# Patient Record
Sex: Male | Born: 1965 | Race: White | Hispanic: No | Marital: Married | Smoking: Former smoker
Health system: Southern US, Community
[De-identification: ages and names within clinical notes are randomized; demographics above are authoritative.]

## PROBLEM LIST (undated history)

## (undated) ENCOUNTER — Ambulatory Visit: Admission: EM

## (undated) DIAGNOSIS — I609 Nontraumatic subarachnoid hemorrhage, unspecified: Secondary | ICD-10-CM

## (undated) DIAGNOSIS — M549 Dorsalgia, unspecified: Secondary | ICD-10-CM

## (undated) DIAGNOSIS — G43909 Migraine, unspecified, not intractable, without status migrainosus: Secondary | ICD-10-CM

## (undated) HISTORY — DX: Dorsalgia, unspecified: M54.9

## (undated) HISTORY — DX: Nontraumatic subarachnoid hemorrhage, unspecified: I60.9

## (undated) HISTORY — DX: Migraine, unspecified, not intractable, without status migrainosus: G43.909

## (undated) HISTORY — PX: BACK SURGERY: SHX140

---

## 2012-02-09 ENCOUNTER — Emergency Department (HOSPITAL_COMMUNITY): Payer: PRIVATE HEALTH INSURANCE

## 2012-02-09 ENCOUNTER — Emergency Department (HOSPITAL_COMMUNITY)
Admission: EM | Admit: 2012-02-09 | Discharge: 2012-02-10 | Disposition: A | Discharge status: Home / Self Care | Payer: PRIVATE HEALTH INSURANCE | Attending: Emergency Medicine | Admitting: Emergency Medicine

## 2012-02-09 ENCOUNTER — Encounter (HOSPITAL_COMMUNITY): Payer: Self-pay | Admitting: Emergency Medicine

## 2012-02-09 DIAGNOSIS — R109 Unspecified abdominal pain: Secondary | ICD-10-CM

## 2012-02-09 DIAGNOSIS — F17200 Nicotine dependence, unspecified, uncomplicated: Secondary | ICD-10-CM | POA: Insufficient documentation

## 2012-02-09 DIAGNOSIS — R1031 Right lower quadrant pain: Secondary | ICD-10-CM | POA: Insufficient documentation

## 2012-02-09 LAB — CBC WITH DIFFERENTIAL/PLATELET
Eosinophils Absolute: 0.2 10*3/uL (ref 0.0–0.7)
Eosinophils Relative: 1 % (ref 0–5)
HCT: 44.2 % (ref 39.0–52.0)
Lymphocytes Relative: 18 % (ref 12–46)
Lymphs Abs: 2.4 10*3/uL (ref 0.7–4.0)
MCH: 30.5 pg (ref 26.0–34.0)
MCV: 86 fL (ref 78.0–100.0)
Monocytes Absolute: 0.6 10*3/uL (ref 0.1–1.0)
Platelets: 280 10*3/uL (ref 150–400)
RBC: 5.14 MIL/uL (ref 4.22–5.81)
WBC: 12.9 10*3/uL — ABNORMAL HIGH (ref 4.0–10.5)

## 2012-02-09 LAB — COMPREHENSIVE METABOLIC PANEL
ALT: 13 U/L (ref 0–53)
BUN: 16 mg/dL (ref 6–23)
CO2: 22 mEq/L (ref 19–32)
Calcium: 9.2 mg/dL (ref 8.4–10.5)
Creatinine, Ser: 0.9 mg/dL (ref 0.50–1.35)
GFR calc Af Amer: >90 mL/min (ref >90)
GFR calc non Af Amer: >90 mL/min (ref >90)
Glucose, Bld: 158 mg/dL — ABNORMAL HIGH (ref 70–99)
Sodium: 137 mEq/L (ref 135–145)
Total Protein: 7 g/dL (ref 6.0–8.3)

## 2012-02-09 LAB — URINALYSIS, ROUTINE W REFLEX MICROSCOPIC
Nitrite: NEGATIVE
Protein, ur: NEGATIVE mg/dL
Specific Gravity, Urine: 1.02 (ref 1.005–1.030)
Urobilinogen, UA: 0.2 mg/dL (ref 0.0–1.0)

## 2012-02-09 LAB — LIPASE, BLOOD: Lipase: 21 U/L (ref 11–59)

## 2012-02-09 LAB — URINE MICROSCOPIC-ADD ON

## 2012-02-09 MED ORDER — HYDROMORPHONE HCL PF 1 MG/ML IJ SOLN
1.0000 mg | Freq: Once | INTRAMUSCULAR | Status: AC
  Administered 2012-02-09: 1 mg via INTRAVENOUS
  Filled 2012-02-09: qty 1

## 2012-02-09 MED ORDER — ONDANSETRON HCL 4 MG/2ML IJ SOLN
4.0000 mg | Freq: Once | INTRAMUSCULAR | Status: AC
Start: 2012-02-09 — End: 2012-02-09
  Administered 2012-02-09: 4 mg via INTRAVENOUS
  Filled 2012-02-09: qty 2

## 2012-02-09 NOTE — ED Notes (Signed)
Pt c/o r abd sharp and shooting pain that radiate to right side of back since 1pm today.  Denies n/v or diarrhea.  No hx of kidney stones.

## 2012-02-09 NOTE — ED Provider Notes (Signed)
History     CSN: [id number]  Arrival date & time 02/09/12  Glen Carter   First MD Initiated Contact with Patient 02/09/12 2125      Chief Complaint  Patient presents with  . Abdominal Pain   HPI  History provided by the patient. Patient is a 46 year old male with history of lower back surgery who presents with complaints of right-sided abdominal pain that began earlier today. Patient states that pain began acutely around 1 PM while at work. Patient is sharp. Pain has since radiates some to the right side and flank area. Patient went to urgent care Center for evaluation bolus told he would need to come to emergency room for further evaluation. Patient has no prior history of abdominal surgery. He denies feeling any subjective fever, chills or sweats. Denies any episodes of nausea vomiting. Denies any recent constipation or diarrhea. He denies any other aggravating or alleviating factors.    History reviewed. No pertinent past medical history.  Past Surgical History  Procedure Date  . Back surgery     No family history on file.  History  Substance Use Topics  . Smoking status: Current Everyday Smoker -- 1.0 packs/day    Types: Cigarettes  . Smokeless tobacco: Not on file  . Alcohol Use: No      Review of Systems  Constitutional: Negative for fever, chills and appetite change.  Respiratory: Negative for cough and shortness of breath.   Cardiovascular: Negative for chest pain.  Gastrointestinal: Positive for abdominal pain. Negative for nausea, vomiting, diarrhea and constipation.  Genitourinary: Negative for dysuria, frequency, hematuria, flank pain and discharge.  All other systems reviewed and are negative.    Allergies  Review of patient's allergies indicates no known allergies.  Home Medications   Current Outpatient Rx  Name Route Sig Dispense Refill  . IBUPROFEN 200 MG PO TABS Oral Take 800 mg by mouth every 6 (six) hours as needed.      BP 115/65  Pulse 64  Temp  98.1 F (36.7 C) (Oral)  Resp 16  Wt 110 lb (49.896 kg)  SpO2 100%  Physical Exam  Nursing note and vitals reviewed. Constitutional: He is oriented to person, place, and time. He appears well-developed and well-nourished. No distress.  HENT:  Head: Normocephalic.  Cardiovascular: Normal rate and regular rhythm.   Pulmonary/Chest: Effort normal and breath sounds normal.  Abdominal: Soft. He exhibits no distension. There is tenderness in the right lower quadrant. There is no rebound, no guarding, no CVA tenderness, no tenderness at McBurney's point and negative Murphy's sign.  Neurological: He is alert and oriented to person, place, and time.  Skin: Skin is warm.  Psychiatric: He has a normal mood and affect. His behavior is normal.    ED Course  Procedures   Results for orders placed during the hospital encounter of 02/09/12  CBC WITH DIFFERENTIAL      Component Value Range   WBC 12.9 (*) 4.0 - 10.5 K/uL   RBC 5.14  4.22 - 5.81 MIL/uL   Hemoglobin 15.7  13.0 - 17.0 g/dL   HCT 21.3  08.6 - 57.8 %   MCV 86.0  78.0 - 100.0 fL   MCH 30.5  26.0 - 34.0 pg   MCHC 35.5  30.0 - 36.0 g/dL   RDW 46.9  62.9 - 52.8 %   Platelets 280  150 - 400 K/uL   Neutrophils Relative 75  43 - 77 %   Neutro Abs 9.7 (*) 1.7 -  7.7 K/uL   Lymphocytes Relative 18  12 - 46 %   Lymphs Abs 2.4  0.7 - 4.0 K/uL   Monocytes Relative 5  3 - 12 %   Monocytes Absolute 0.6  0.1 - 1.0 K/uL   Eosinophils Relative 1  0 - 5 %   Eosinophils Absolute 0.2  0.0 - 0.7 K/uL   Basophils Relative 1  0 - 1 %   Basophils Absolute 0.1  0.0 - 0.1 K/uL  COMPREHENSIVE METABOLIC PANEL      Component Value Range   Sodium 137  135 - 145 mEq/L   Potassium 3.8  3.5 - 5.1 mEq/L   Chloride 102  96 - 112 mEq/L   CO2 22  19 - 32 mEq/L   Glucose, Bld 158 (*) 70 - 99 mg/dL   BUN 16  6 - 23 mg/dL   Creatinine, Ser 4.09  0.50 - 1.35 mg/dL   Calcium 9.2  8.4 - 81.1 mg/dL   Total Protein 7.0  6.0 - 8.3 g/dL   Albumin 4.3  3.5 - 5.2  g/dL   AST 15  0 - 37 U/L   ALT 13  0 - 53 U/L   Alkaline Phosphatase 94  39 - 117 U/L   Total Bilirubin 0.3  0.3 - 1.2 mg/dL   GFR calc non Af Amer >90  >90 mL/min   GFR calc Af Amer >90  >90 mL/min  LIPASE, BLOOD      Component Value Range   Lipase 21  11 - 59 U/L  URINALYSIS, ROUTINE W REFLEX MICROSCOPIC      Component Value Range   Color, Urine YELLOW  YELLOW   APPearance CLEAR  CLEAR   Specific Gravity, Urine 1.020  1.005 - 1.030   pH 6.5  5.0 - 8.0   Glucose, UA NEGATIVE  NEGATIVE mg/dL   Hgb urine dipstick TRACE (*) NEGATIVE   Bilirubin Urine NEGATIVE  NEGATIVE   Ketones, ur 40 (*) NEGATIVE mg/dL   Protein, ur NEGATIVE  NEGATIVE mg/dL   Urobilinogen, UA 0.2  0.0 - 1.0 mg/dL   Nitrite NEGATIVE  NEGATIVE   Leukocytes, UA NEGATIVE  NEGATIVE  URINE MICROSCOPIC-ADD ON      Component Value Range   RBC / HPF 3-6  <3 RBC/hpf      Ct Abdomen Pelvis W Contrast  02/10/2012  *RADIOLOGY REPORT*  Clinical Data: Right lower quadrant abdominal pain radiating into the right back.  Microscopic hematuria and elevated white blood cell count.  CT ABDOMEN AND PELVIS WITH CONTRAST  Technique:  Multidetector CT imaging of the abdomen and pelvis was performed following the standard protocol during bolus administration of intravenous contrast.  Contrast: OMNIPAQUE IOHEXOL 300 MG/ML SOLN  Comparison: None.  Findings: The liver, gallbladder, spleen, pancreas, adrenal glands and kidneys are within normal limits.  No renal calculi or masses are identified.  The ureters contain no calculi.  The bladder is unremarkable.  Bowel loops show no evidence of inflammation or obstruction.  The appendix is visible off of the tip of the cecum and is normal in appearance with no evidence of appendicitis.  No free fluid or abscess is identified.  No evidence of bowel perforation.  No hernias are seen.  No masses or enlarged lymph nodes.  The abdominal aorta is of normal caliber.  Bony structures show significant  spondylosis at L5-S1 with prominent endplate sclerosis present on both sides of the disc and vacuum disc phenomenon as well as  near complete loss of disc space height.  IMPRESSION: No acute process is identified in the abdomen or pelvis.  There is no evidence of acute appendicitis.  Advanced spondylosis is present at L5-S1.  Original Report Authenticated By: Reola Calkins, M.D.     1. Abdominal pain       MDM  Pt seen and evaluated. Patient sent from urgent care Center for further evaluation of right-sided abdominal pain. Patient with no peritoneal signs on exam. At this time will treat pain and evaluated lab testing.   Patient reports feeling much better after pain medications. Patient has slightly elevated WC. Will order CT scan to rule out early acute appendicitis.   CT scan unremarkable. Patient has continued to be comfortable. This time we'll discharge home with instructions for followup with PCP.     Angus Seller, [place] 02/10/12 [phone]

## 2012-02-09 NOTE — ED Notes (Signed)
Pt alert, arrives from home, c/o rlq abd pain, onset was today, describes as sharp, radiates to back, resp even unlabored, skin pwd, seen in urgent care, instructed to come to ER

## 2012-02-10 IMAGING — CT CT ABD-PELV W/ CM
1 of 3 series · 14 of 32 positions shown, 19 images · IV contrast (100 ML OMNI 300)
Comparison: None.

CLINICAL DATA: Right lower quadrant abdominal pain radiating into
the right back.  Microscopic hematuria and elevated white blood
cell count.

CT ABDOMEN AND PELVIS WITH CONTRAST
TECHNIQUE: Multidetector CT imaging of the abdomen and pelvis was
performed following the standard protocol during bolus
administration of intravenous contrast.
Contrast: 100mL OMNIPAQUE IOHEXOL 300 MG/ML SOLN

[Series 2: abd/pel with · axial · 0.57mm/px · z∈[+988,+1318]mm · 14 of 74 slices shown, 19 images]
[im 4/74  soft-tissue]
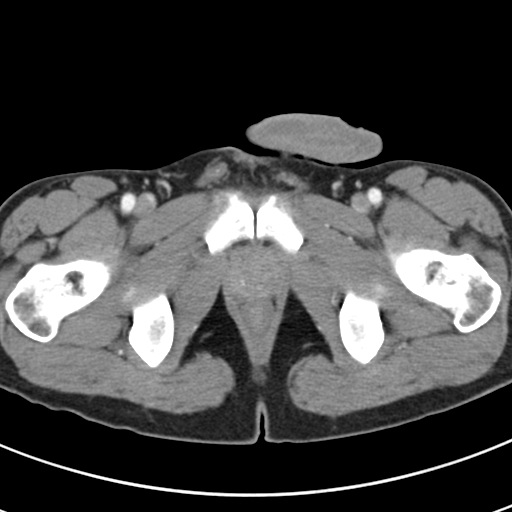
[im 4/74  bone]
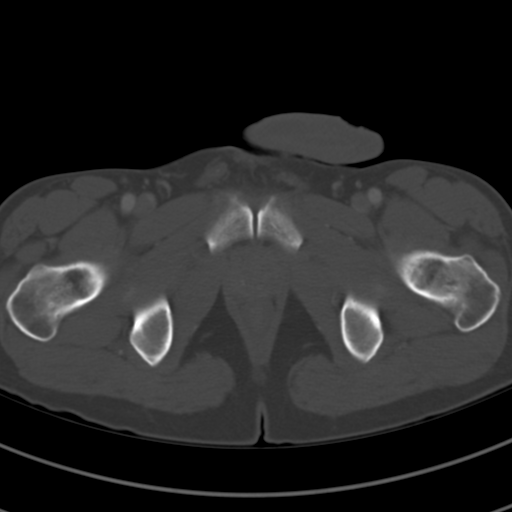
[im 11/74  soft-tissue]
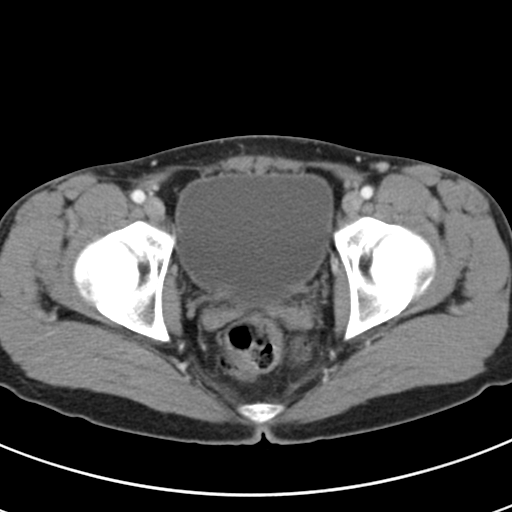
[im 15/74  soft-tissue]
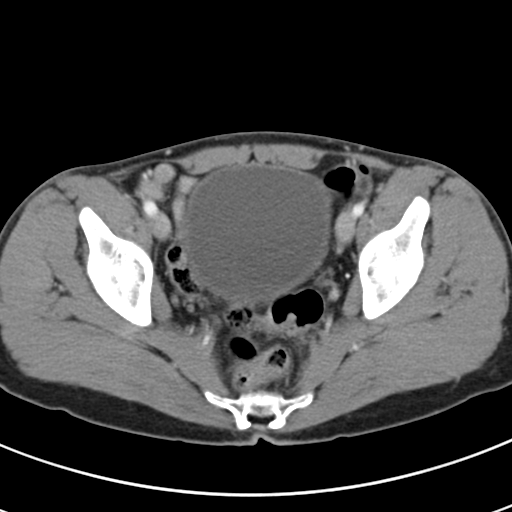
[im 22/74  soft-tissue]
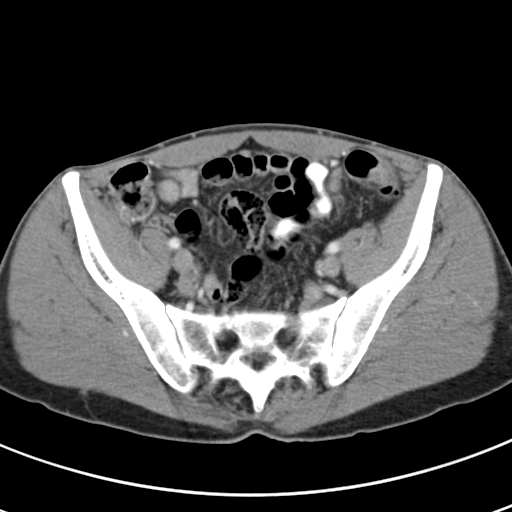
[im 26/74  soft-tissue]
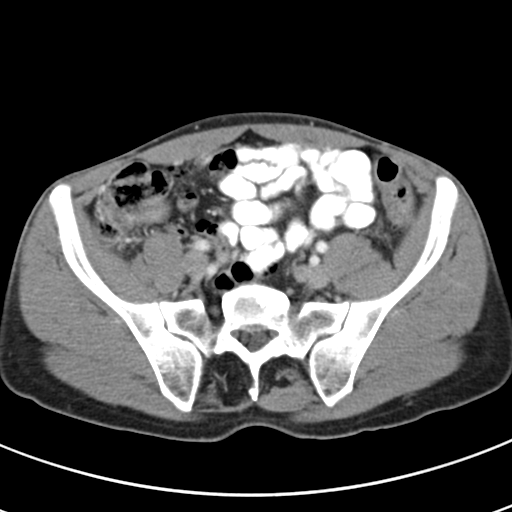
[im 33/74  soft-tissue]
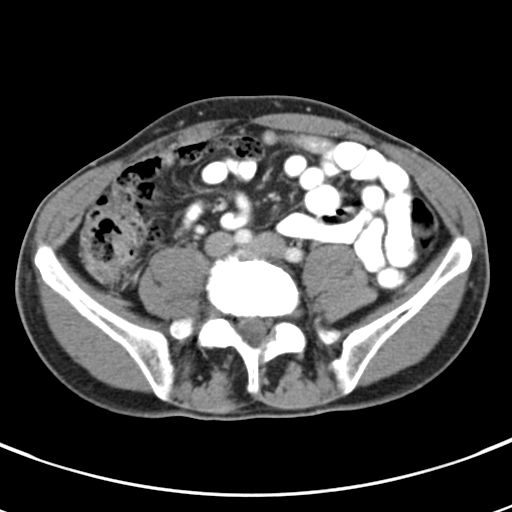
[im 37/74  soft-tissue]
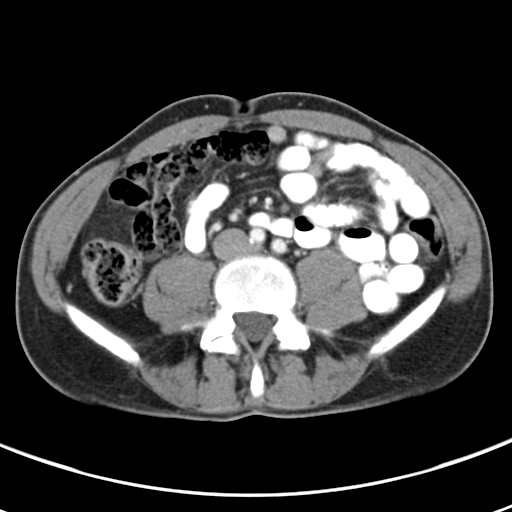
[im 41/74  soft-tissue]
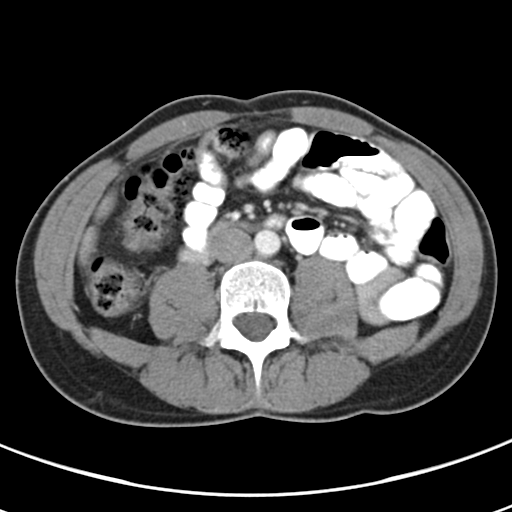
[im 48/74  soft-tissue]
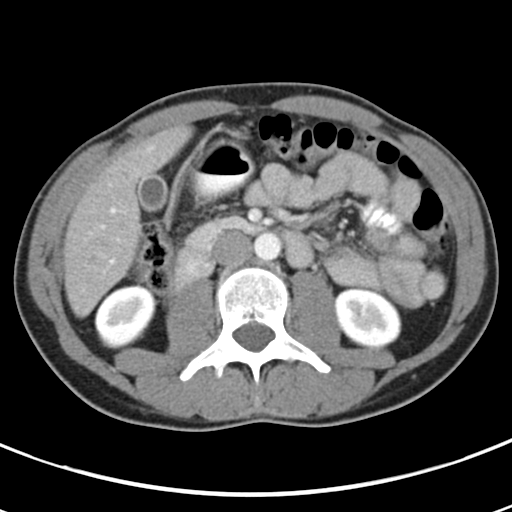
[im 48/74  bone]
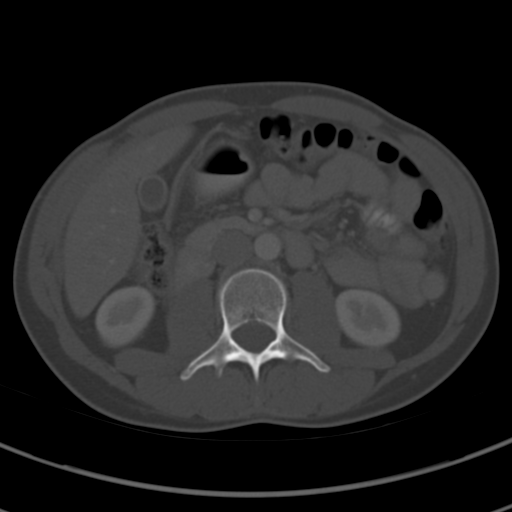
[im 52/74  soft-tissue]
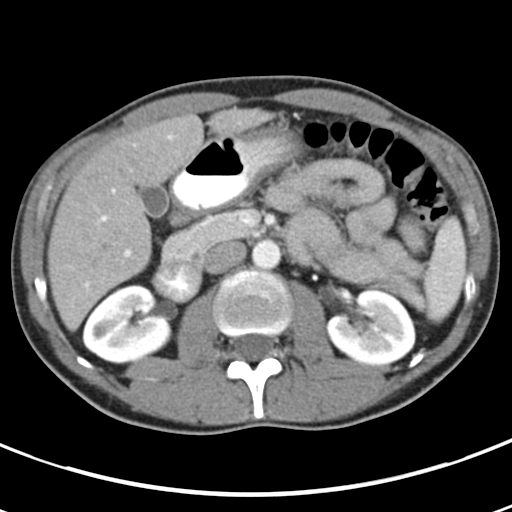
[im 59/74  soft-tissue]
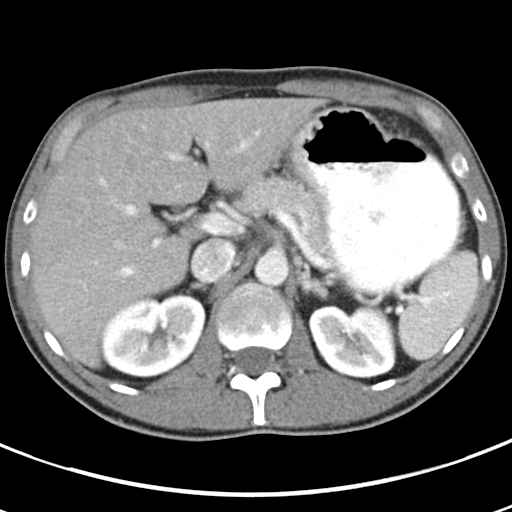
[im 59/74  lung]
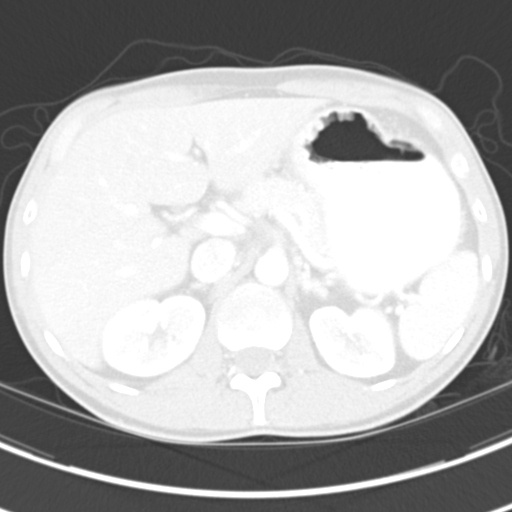
[im 63/74  soft-tissue]
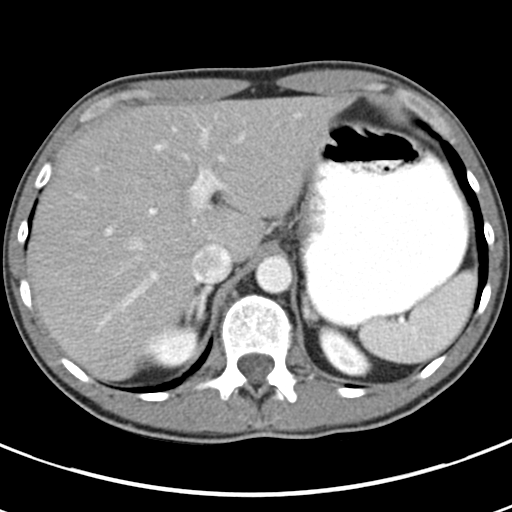
[im 63/74  lung]
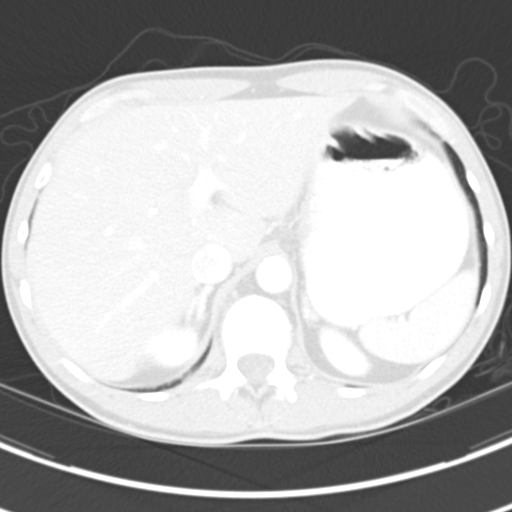
[im 66/74  lung]
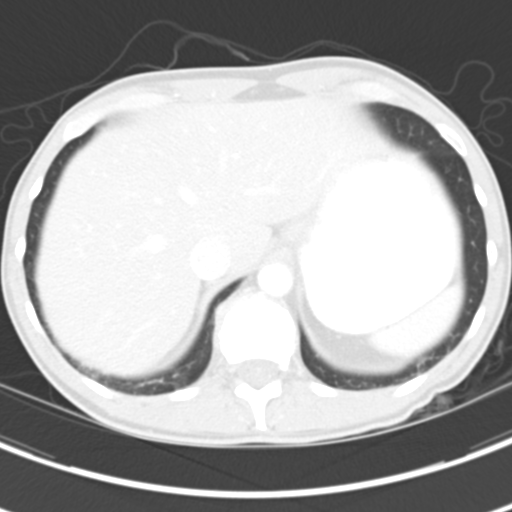
[im 70/74  soft-tissue]
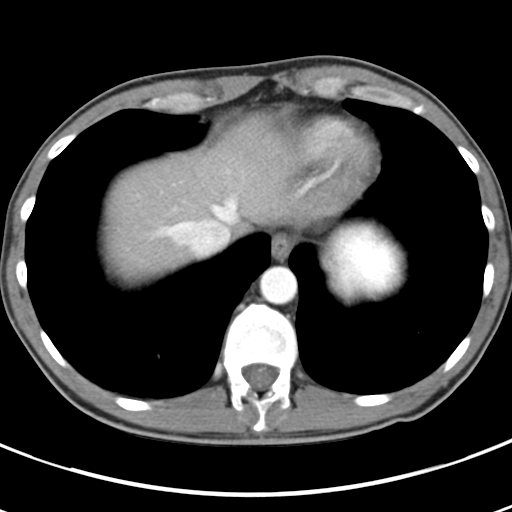
[im 70/74  lung]
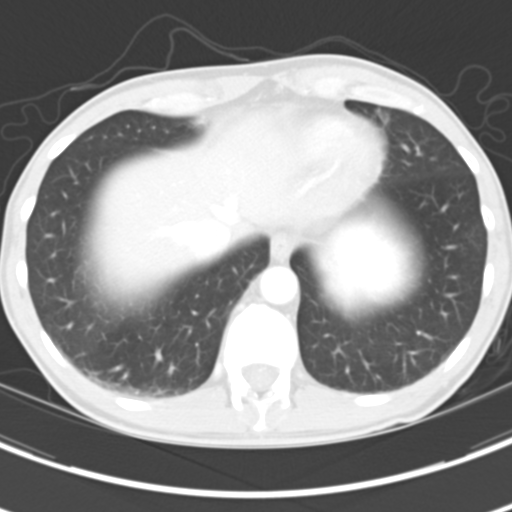

[14 of 32 positions shown; findings below may reference images not displayed]

FINDINGS: The liver, gallbladder, spleen, pancreas, adrenal glands
and kidneys are within normal limits.  No renal calculi or masses
are identified.  The ureters contain no calculi.  The bladder is
unremarkable.

Bowel loops show no evidence of inflammation or obstruction.  The
appendix is visible off of the tip of the cecum and is normal in
appearance with no evidence of appendicitis.  No free fluid or
abscess is identified.  No evidence of bowel perforation.  No
hernias are seen.  No masses or enlarged lymph nodes.  The
abdominal aorta is of normal caliber.  Bony structures show
significant spondylosis at L5-S1 with prominent endplate sclerosis
present on both sides of the disc and vacuum disc phenomenon as
well as near complete loss of disc space height.
IMPRESSION: No acute process is identified in the abdomen or pelvis.  There is
no evidence of acute appendicitis.  Advanced spondylosis is present
at L5-S1.

## 2012-02-10 MED ORDER — IOHEXOL 300 MG/ML  SOLN
100.0000 mL | Freq: Once | INTRAMUSCULAR | Status: AC | PRN
  Administered 2012-02-10: 100 mL via INTRAVENOUS

## 2012-02-10 MED ORDER — HYDROCODONE-ACETAMINOPHEN 5-325 MG PO TABS: 1.0000 | ORAL_TABLET | ORAL | Status: DC | PRN

## 2012-02-11 NOTE — ED Provider Notes (Signed)
Medical screening examination/treatment/procedure(s) were performed by non-physician practitioner and as supervising physician I was immediately available for consultation/collaboration.  Gerhard Munch, MD 02/11/12 [phone]

## 2012-02-13 ENCOUNTER — Encounter (HOSPITAL_COMMUNITY): Payer: Self-pay | Admitting: *Deleted

## 2012-02-13 ENCOUNTER — Emergency Department (HOSPITAL_COMMUNITY)
Admission: EM | Admit: 2012-02-13 | Discharge: 2012-02-13 | Disposition: A | Discharge status: Home / Self Care | Payer: PRIVATE HEALTH INSURANCE | Attending: Emergency Medicine | Admitting: Emergency Medicine

## 2012-02-13 DIAGNOSIS — M545 Low back pain, unspecified: Secondary | ICD-10-CM | POA: Insufficient documentation

## 2012-02-13 DIAGNOSIS — M549 Dorsalgia, unspecified: Secondary | ICD-10-CM

## 2012-02-13 MED ORDER — IBUPROFEN 800 MG PO TABS: 800.0000 mg | ORAL_TABLET | Freq: Three times a day (TID) | ORAL | Status: AC | PRN

## 2012-02-13 MED ORDER — METHOCARBAMOL 500 MG PO TABS: 1000.0000 mg | ORAL_TABLET | Freq: Four times a day (QID) | ORAL | Status: AC

## 2012-02-13 MED ORDER — HYDROCODONE-ACETAMINOPHEN 5-325 MG PO TABS: ORAL_TABLET | ORAL | Status: AC

## 2012-02-13 NOTE — ED Provider Notes (Signed)
History     CSN: [id number]  Arrival date & time 02/13/12  4098   First MD Initiated Contact with Patient 02/13/12 1020      Chief Complaint  Patient presents with  . Back Pain    lower right back     (Consider location/radiation/quality/duration/timing/severity/associated sxs/prior treatment) HPI Comments: Patients with a history of an L5-S1 discectomy -- presents with 5 days of lower and middle right-sided back pain. Patient was evaluated in emergency department 5 days ago with right-sided back pain and abdominal pain. Patient had a CT scan which was negative for kidney stone or other intra-abdominal pathology. He was discharged home with Vicodin. This along with ibuprofen has helped his pain. Patient states that the pain is stabbing in nature. It no longer goes to his abdomen. Patient states that it is worse with standing and resolved with lying flat and resting. 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 He has not been able to work since his pain started. Today the pain started the patient's replaced and moved a hot water heater. Course is persistent. Patient denies all red flag signs and symptoms of lower back pain. He denies change in bowel movements, dysuria or hematuria. He denies nausea or vomiting. He denies weakness, numbness, tingling in his lower extremities. These symptoms are not similar to his previous radicular pain.  Patient is a 46 y.o. male presenting with back pain. The history is provided by the patient.  Back Pain  This is a new problem. The current episode started more than 2 days ago. The problem occurs constantly. The problem has not changed since onset.The pain is associated with lifting heavy objects. The pain is present in the thoracic spine and lumbar spine. The quality of the pain is described as stabbing. Radiates to: R abdomen. The pain is moderate. The symptoms are aggravated by certain positions. Pertinent  negatives include no fever, no numbness, no weight loss, no abdominal pain (now resolved), no abdominal swelling, no bowel incontinence, no perianal numbness, no bladder incontinence, no dysuria, no leg pain, no paresthesias and no weakness. He has tried NSAIDs and analgesics for the symptoms. The treatment provided moderate relief.    History reviewed. No pertinent past medical history.  Past Surgical History  Procedure Date  . Back surgery     No family history on file.  History  Substance Use Topics  . Smoking status: Current Everyday Smoker -- 1.0 packs/day    Types: Cigarettes  . Smokeless tobacco: Not on file  . Alcohol Use: No      Review of Systems  Constitutional: Negative for fever, weight loss and unexpected weight change.  Gastrointestinal: Negative for nausea, vomiting, abdominal pain (now resolved), diarrhea, constipation and bowel incontinence.       Neg for fecal incontinence  Genitourinary: Negative for bladder incontinence, dysuria, hematuria, flank pain and difficulty urinating.       Negative for urinary incontinence or retention  Musculoskeletal: Positive for back pain.  Neurological: Negative for weakness, numbness and paresthesias.       Negative for saddle paresthesias     Allergies  Review of patient's allergies indicates no known allergies.  Home Medications   Current Outpatient Rx  Name Route Sig Dispense Refill  . HYDROCODONE-ACETAMINOPHEN 5-325 MG PO TABS Oral Take 1-2 tablets by mouth every 4 (four) hours as needed for pain. 10 tablet 0  . IBUPROFEN 200 MG PO TABS  Oral Take 800 mg by mouth every 6 (six) hours as needed. For pain    . AZO STANDARD MAXIMUM STRENGTH PO Oral Take 2 tablets by mouth daily.      BP 137/89  Pulse 80  Temp 97.9 F (36.6 C) (Oral)  Resp 16  Wt 110 lb (49.896 kg)  SpO2 96%  Physical Exam  Nursing note and vitals reviewed. Constitutional: He appears well-developed and well-nourished.  HENT:  Head:  Normocephalic and atraumatic.  Eyes: Conjunctivae are normal.  Neck: Normal range of motion.  Cardiovascular: Normal rate, regular rhythm and normal heart sounds.   No murmur heard. Pulses:      Dorsalis pedis pulses are 2+ on the right side, and 2+ on the left side.       Posterior tibial pulses are 2+ on the right side, and 2+ on the left side.  Pulmonary/Chest: Effort normal and breath sounds normal. No respiratory distress. He has no wheezes.  Abdominal: Soft. Bowel sounds are normal. He exhibits no pulsatile midline mass and no mass. There is no tenderness. There is no rebound, no guarding and no CVA tenderness.  Musculoskeletal: Normal range of motion. He exhibits no tenderness.       Cervical back: Normal.       Thoracic back: He exhibits tenderness. He exhibits normal range of motion, no bony tenderness and no swelling.       Lumbar back: He exhibits tenderness. He exhibits normal range of motion, no bony tenderness and no swelling.       Back:       No step-off noted with palpation of spine.   Neurological: He is alert. He has normal reflexes. No sensory deficit. He exhibits normal muscle tone. Gait normal.       5/5 strength in entire lower extremities bilaterally. No sensation deficit.   Skin: Skin is warm and dry.  Psychiatric: He has a normal mood and affect.    ED Course  Procedures (including critical care time)  Labs Reviewed - No data to display No results found.   1. Back pain     10:45 AM Patient seen and examined. Patient refuses pain medication here.    Vital signs reviewed and are as follows: Filed Vitals:   02/13/12 1016  BP: 137/89  Pulse: 80  Temp: 97.9 F (36.6 C)  Resp: 16   Patient was seen and examined. No red flag s/s of low back pain. Patient was counseled on back pain precautions and told to do activity as tolerated but do not lift, push, or pull heavy objects more than 10 pounds for the next week.  Patient counseled to use ice or heat on  back for no longer than 15 minutes every hour.   Patient prescribed muscle relaxer and counseled on proper use of muscle relaxant medication.    Patient prescribed narcotic pain medicine and counseled on proper use of narcotic pain medications. Counseled not to combine this medication with others containing tylenol.   Urged patient not to drink alcohol, drive, or perform any other activities that requires focus while taking either of these medications.  Patient urged to follow-up with PCP/ortho if pain does not improve with treatment and rest or if pain becomes recurrent. Urged to return with worsening severe pain, loss of bowel or bladder control, trouble walking.   The patient verbalizes understanding and agrees with the plan.      MDM  Patient with back pain. No neurological deficits. Patient is ambulatory.  Pain is changed with position and movement and seems to have MSK component. No warning symptoms of back pain including: loss of bowel or bladder control, night sweats, waking from sleep with back pain, unexplained fevers or weight loss, h/o cancer, IVDU, recent trauma. No concern for cauda equina, epidural abscess, or other serious cause of back pain. Conservative measures such as rest, ice/heat and pain medicine indicated with PCP follow-up if no improvement with conservative management.   Do not suspect intraabdominal etiology given exam. Do not suspect AAA, recent normal CT abd.          Renne Crigler, [place] 02/13/12 1101

## 2012-02-13 NOTE — ED Notes (Signed)
Pt states "was seen here Wednesday, I'm a plumber, changed out a water heater, they told me I had blood in my urine & could've passed a stone, they gave me 10 vicodin and told me to come back if it didn't get better"; pt denies dysuria.

## 2012-02-13 NOTE — ED Provider Notes (Signed)
Medical screening examination/treatment/procedure(s) were performed by non-physician practitioner and as supervising physician I was immediately available for consultation/collaboration.  Feras Gardella, MD 02/13/12 1445 

## 2012-02-13 NOTE — ED Notes (Signed)
Pt escorted to discharge window, in no distress. 

## 2019-10-14 ENCOUNTER — Ambulatory Visit: Payer: PRIVATE HEALTH INSURANCE | Attending: Internal Medicine

## 2019-10-14 DIAGNOSIS — Z23 Encounter for immunization: Secondary | ICD-10-CM

## 2019-10-14 NOTE — Progress Notes (Signed)
   Covid-19 Vaccination Clinic  Name:  Glen Carter    MRN: [id number] DOB: September 16, 1965  10/14/2019  Mr. [name] was observed post Covid-19 immunization for 15 minutes without incident. He was provided with Vaccine Information Sheet and instruction to access the V-Safe system.   Mr. [name] was instructed to call 911 with any severe reactions post vaccine: Marland Kitchen Difficulty breathing  . Swelling of face and throat  . A fast heartbeat  . A bad rash all over body  . Dizziness and weakness   Immunizations Administered    Name Date Dose VIS Date Route   Pfizer COVID-19 Vaccine 10/14/2019 12:44 PM 0.3 mL 06/29/2019 Intramuscular   Manufacturer: [employer], [employer]   Lot: [id number]   NDC: [address]-0

## 2019-11-06 ENCOUNTER — Ambulatory Visit: Payer: PRIVATE HEALTH INSURANCE | Attending: Internal Medicine

## 2019-11-06 DIAGNOSIS — Z23 Encounter for immunization: Secondary | ICD-10-CM

## 2019-11-06 NOTE — Progress Notes (Signed)
   Covid-19 Vaccination Clinic  Name:  Glen Carter    MRN: [id number] DOB: 01/07/1966  11/06/2019  [name] was observed post Covid-19 immunization for 15 minutes without incident. He was provided with Vaccine Information Sheet and instruction to access the V-Safe system.   [name] was instructed to call 911 with any severe reactions post vaccine: Marland Kitchen Difficulty breathing  . Swelling of face and throat  . A fast heartbeat  . A bad rash all over body  . Dizziness and weakness   Immunizations Administered    Name Date Dose VIS Date Route   Pfizer COVID-19 Vaccine 11/06/2019  3:11 PM 0.3 mL 09/12/2018 Intramuscular   Manufacturer: [employer], [employer]   Lot: [id number]   NDC: [address]-7

## 2020-10-14 ENCOUNTER — Other Ambulatory Visit: Payer: Self-pay

## 2020-10-14 ENCOUNTER — Ambulatory Visit
Admission: EM | Admit: 2020-10-14 | Discharge: 2020-10-14 | Disposition: A | Discharge status: Home / Self Care | Payer: Commercial Managed Care - PPO | Attending: Family Medicine | Admitting: Family Medicine

## 2020-10-14 ENCOUNTER — Other Ambulatory Visit (HOSPITAL_BASED_OUTPATIENT_CLINIC_OR_DEPARTMENT_OTHER): Payer: Self-pay

## 2020-10-14 ENCOUNTER — Inpatient Hospital Stay (HOSPITAL_BASED_OUTPATIENT_CLINIC_OR_DEPARTMENT_OTHER)
Admission: EM | Admit: 2020-10-14 | Discharge: 2020-10-20 | DRG: 066 | Disposition: A | Discharge status: Home / Self Care | Payer: Commercial Managed Care - PPO | Attending: Neurosurgery | Admitting: Neurosurgery

## 2020-10-14 ENCOUNTER — Emergency Department (HOSPITAL_BASED_OUTPATIENT_CLINIC_OR_DEPARTMENT_OTHER): Payer: Commercial Managed Care - PPO

## 2020-10-14 ENCOUNTER — Inpatient Hospital Stay (HOSPITAL_COMMUNITY): Payer: Commercial Managed Care - PPO

## 2020-10-14 ENCOUNTER — Encounter (HOSPITAL_BASED_OUTPATIENT_CLINIC_OR_DEPARTMENT_OTHER): Payer: Self-pay

## 2020-10-14 DIAGNOSIS — R29700 NIHSS score 0: Secondary | ICD-10-CM | POA: Diagnosis present

## 2020-10-14 DIAGNOSIS — F17210 Nicotine dependence, cigarettes, uncomplicated: Secondary | ICD-10-CM | POA: Diagnosis present

## 2020-10-14 DIAGNOSIS — Z20822 Contact with and (suspected) exposure to covid-19: Secondary | ICD-10-CM | POA: Diagnosis present

## 2020-10-14 DIAGNOSIS — R519 Headache, unspecified: Secondary | ICD-10-CM

## 2020-10-14 DIAGNOSIS — I609 Nontraumatic subarachnoid hemorrhage, unspecified: Secondary | ICD-10-CM | POA: Diagnosis present

## 2020-10-14 DIAGNOSIS — R42 Dizziness and giddiness: Secondary | ICD-10-CM

## 2020-10-14 DIAGNOSIS — R231 Pallor: Secondary | ICD-10-CM

## 2020-10-14 HISTORY — DX: Migraine, unspecified, not intractable, without status migrainosus: G43.909

## 2020-10-14 LAB — CBC
HCT: 46.5 % (ref 39.0–52.0)
Hemoglobin: 15.7 g/dL (ref 13.0–17.0)
MCH: 28.9 pg (ref 26.0–34.0)
MCHC: 33.8 g/dL (ref 30.0–36.0)
MCV: 85.6 fL (ref 80.0–100.0)
Platelets: 321 10*3/uL (ref 150–400)
RBC: 5.43 MIL/uL (ref 4.22–5.81)
RDW: 12.4 % (ref 11.5–15.5)
WBC: 12.7 10*3/uL — ABNORMAL HIGH (ref 4.0–10.5)
nRBC: 0 % (ref 0.0–0.2)

## 2020-10-14 LAB — COMPREHENSIVE METABOLIC PANEL
ALT: 15 U/L (ref 0–44)
AST: 14 U/L — ABNORMAL LOW (ref 15–41)
Albumin: 4.4 g/dL (ref 3.5–5.0)
Alkaline Phosphatase: 81 U/L (ref 38–126)
Anion gap: 9 (ref 5–15)
BUN: 19 mg/dL (ref 6–20)
CO2: 27 mmol/L (ref 22–32)
Calcium: 9 mg/dL (ref 8.9–10.3)
Chloride: 103 mmol/L (ref 98–111)
Creatinine, Ser: 1.06 mg/dL (ref 0.61–1.24)
GFR, Estimated: >60 mL/min (ref >60)
Glucose, Bld: 162 mg/dL — ABNORMAL HIGH (ref 70–99)
Potassium: 4 mmol/L (ref 3.5–5.1)
Sodium: 139 mmol/L (ref 135–145)
Total Bilirubin: 0.5 mg/dL (ref 0.3–1.2)
Total Protein: 7.1 g/dL (ref 6.5–8.1)

## 2020-10-14 LAB — CBC WITH DIFFERENTIAL/PLATELET
Abs Immature Granulocytes: 0.13 10*3/uL — ABNORMAL HIGH (ref 0.00–0.07)
Basophils Absolute: 0.1 10*3/uL (ref 0.0–0.1)
Basophils Relative: 1 %
Eosinophils Absolute: 0.3 10*3/uL (ref 0.0–0.5)
Eosinophils Relative: 3 %
HCT: 47.2 % (ref 39.0–52.0)
Hemoglobin: 15.7 g/dL (ref 13.0–17.0)
Immature Granulocytes: 1 %
Lymphocytes Relative: 12 %
Lymphs Abs: 1.3 10*3/uL (ref 0.7–4.0)
MCH: 28.5 pg (ref 26.0–34.0)
MCHC: 33.3 g/dL (ref 30.0–36.0)
MCV: 85.8 fL (ref 80.0–100.0)
Monocytes Absolute: 0.7 10*3/uL (ref 0.1–1.0)
Monocytes Relative: 7 %
Neutro Abs: 8.6 10*3/uL — ABNORMAL HIGH (ref 1.7–7.7)
Neutrophils Relative %: 76 %
Platelets: 322 10*3/uL (ref 150–400)
RBC: 5.5 MIL/uL (ref 4.22–5.81)
RDW: 12.2 % (ref 11.5–15.5)
WBC: 11.2 10*3/uL — ABNORMAL HIGH (ref 4.0–10.5)
nRBC: 0 % (ref 0.0–0.2)

## 2020-10-14 LAB — PROTIME-INR
INR: 1 (ref 0.8–1.2)
INR: 1 (ref 0.8–1.2)
Prothrombin Time: 12.3 seconds (ref 11.4–15.2)
Prothrombin Time: 12.9 seconds (ref 11.4–15.2)

## 2020-10-14 LAB — RESP PANEL BY RT-PCR (FLU A&B, COVID) ARPGX2
Influenza A by PCR: NEGATIVE
Influenza B by PCR: NEGATIVE
SARS Coronavirus 2 by RT PCR: NEGATIVE

## 2020-10-14 LAB — APTT
aPTT: 26 seconds (ref 24–36)
aPTT: 26 seconds (ref 24–36)

## 2020-10-14 LAB — POCT FASTING CBG KUC MANUAL ENTRY: POCT Glucose (KUC): 159 mg/dL — AB (ref 70–99)

## 2020-10-14 IMAGING — CT CT ANGIO NECK
2 of 4 series · 8 of 14 positions shown · IV contrast (APPLIED)
Comparison: Noncontrast head CT performed earlier today [DATE].

CLINICAL DATA: Stroke/TIA, assess extracranial arteries.

EXAM:
CT ANGIOGRAPHY HEAD AND NECK
TECHNIQUE: Multidetector CT imaging of the head and neck was performed using
the standard protocol during bolus administration of intravenous
contrast. Multiplanar CT image reconstructions and MIPs were
obtained to evaluate the vascular anatomy. Carotid stenosis
measurements (when applicable) are obtained utilizing NASCET
criteria, using the distal internal carotid diameter as the
denominator.
CONTRAST:  75mL OMNIPAQUE IOHEXOL 350 MG/ML SOLN

[Series 7: cta head & neck · axial · 0.51mm/px · z∈[-318,-40]mm · 6 of 764 slices shown]
[im 110/764  soft-tissue]
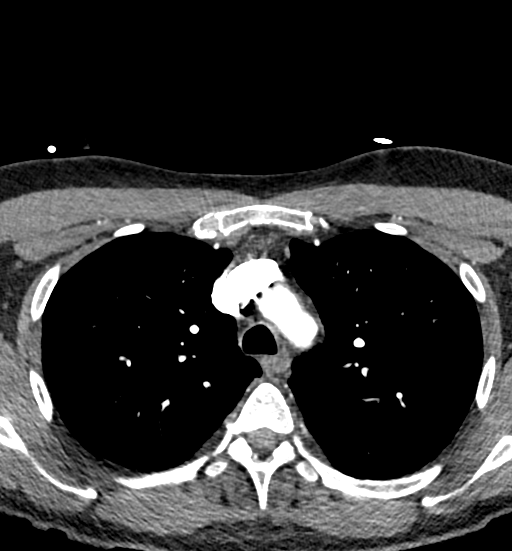
[im 219/764  bone]
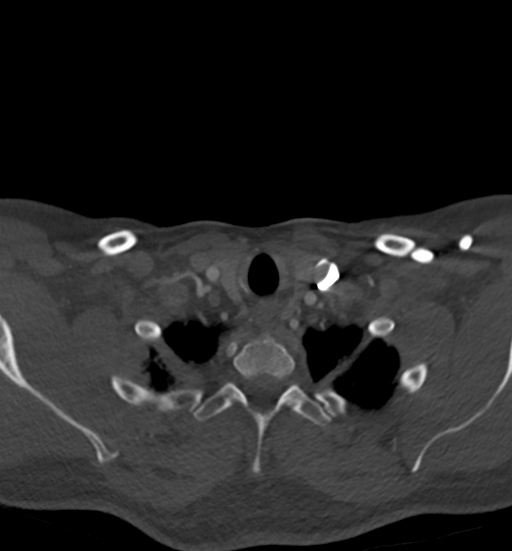
[im 328/764  soft-tissue]
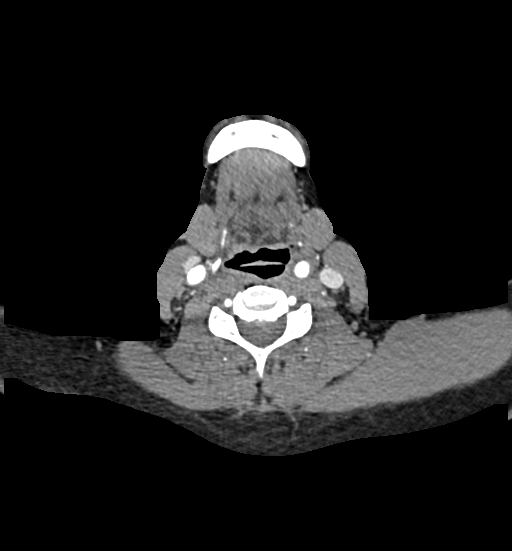
[im 437/764  bone]
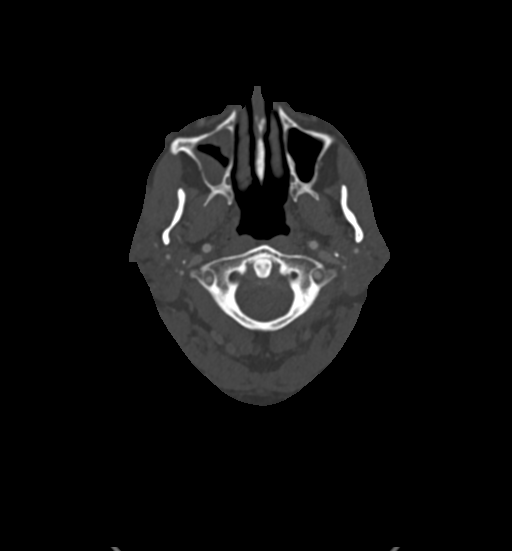
[im 546/764  soft-tissue]
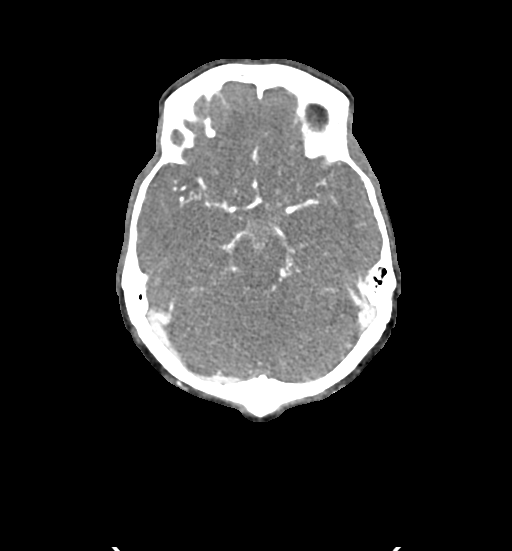
[im 655/764  bone]
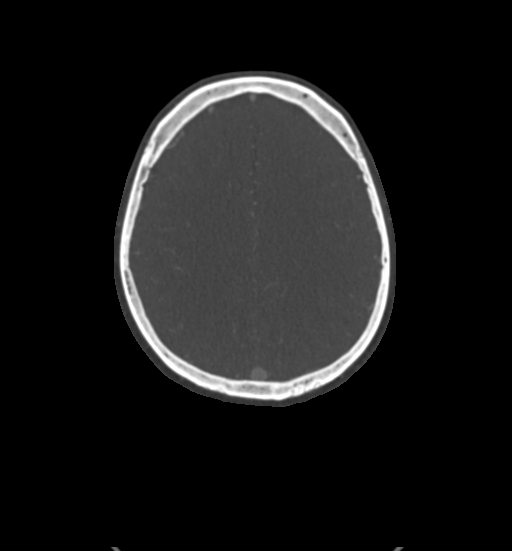

[Series 8: ax thin · axial · 0.57mm/px · z∈[-245,-113]mm · 2 of 385 slices shown]
[im 129/385  soft-tissue]
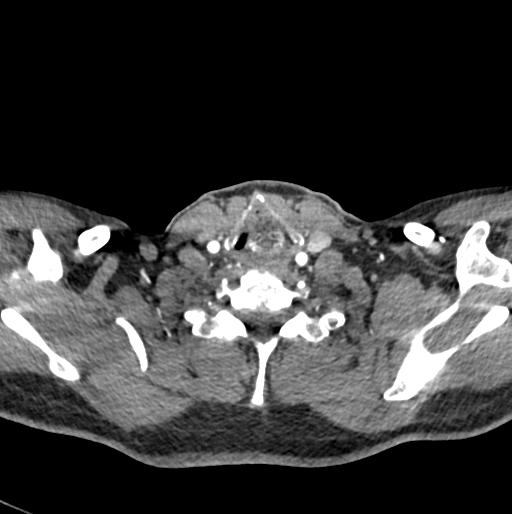
[im 257/385  soft-tissue]
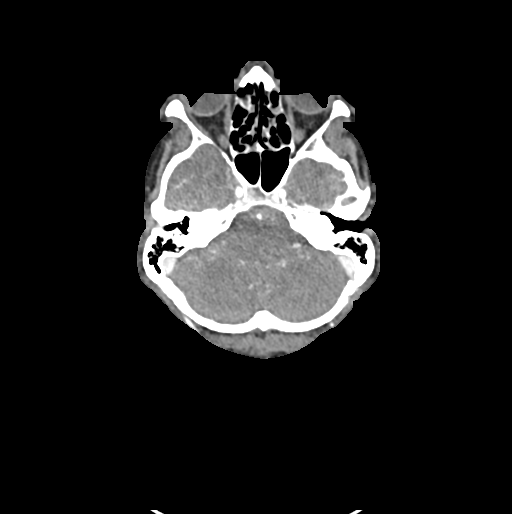

[8 of 14 positions shown; findings below may reference images not displayed]

FINDINGS: CTA NECK FINDINGS

Aortic arch: Standard aortic branching. The visualized aortic arch
is normal in caliber. No hemodynamically significant innominate or
proximal subclavian artery stenosis.

Right carotid system: CCA and ICA patent within the neck without
stenosis. Minimal soft plaque within the proximal ICA.

Left carotid system: CCA and ICA patent within the neck without
stenosis. Minimal soft plaque within the proximal ICA.

Vertebral arteries: Vertebral arteries patent within the neck
without stenosis. Left vertebral artery dominant.

Skeleton: Cervical spondylosis with multilevel disc space narrowing,
disc bulges, endplate spurring and uncovertebral hypertrophy.
Multilevel spinal canal and bony neural foraminal narrowing. No
acute bony abnormality or aggressive osseous lesion.

Other neck: No neck mass or cervical lymphadenopathy. Thyroid
unremarkable.

Upper chest: No consolidation within the imaged lung apices.

Review of the MIP images confirms the above findings

CTA HEAD FINDINGS

Anterior circulation:

The intracranial internal carotid arteries are patent. The M1 middle
cerebral arteries are patent. No M2 proximal branch occlusion or
high-grade proximal stenosis is identified. The anterior cerebral
arteries are patent. No intracranial aneurysm is identified.

Posterior circulation:

The intracranial vertebral arteries are patent. The basilar artery
is patent. The posterior cerebral arteries are patent. Posterior
communicating arteries are hypoplastic or absent bilaterally. No
intracranial aneurysm is identified.

Venous sinuses: Within the limitations of contrast timing, no
convincing thrombus.

Anatomic variants: As described

Review of the MIP images confirms the above findings

These results were called by telephone at the time of interpretation
on [DATE] at [DATE] to provider MARUTA , who verbally
acknowledged these results.
IMPRESSION: CTA neck:

1. The common carotid and internal carotid arteries are patent
within the neck without stenosis. Minimal soft plaque within the
proximal ICAs bilaterally.
2. Vertebral arteries patent within the neck without stenosis.
3. Cervical spondylosis.

CTA head:

1. No intracranial aneurysm is identified. Catheter-based
angiography is recommended for further evaluation.
2. No intracranial large vessel occlusion or proximal high-grade
arterial stenosis.

## 2020-10-14 IMAGING — CT CT HEAD W/O CM
2 series · 15 of 30 positions shown, 17 images · non-contrast
Comparison: No pertinent prior exams available for comparison.

CLINICAL DATA: Cerebral hemorrhage suspected. Additional history
provided: Sudden onset headache at the base of the head, nausea,
vomiting, blurred vision, photosensitivity.

EXAM:
CT HEAD WITHOUT CONTRAST
TECHNIQUE: Contiguous axial images were obtained from the base of the skull
through the vertex without intravenous contrast.

[Series 2: head wo · axial · 0.44mm/px · z∈[-139,-14]mm · 7 of 35 slices shown, 9 images]
[im 5/35  brain]
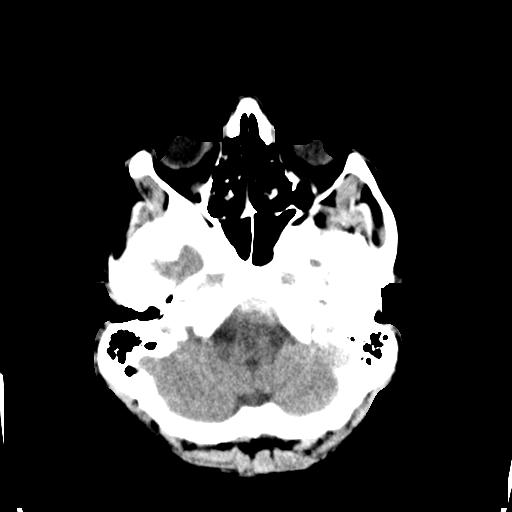
[im 5/35  bone]
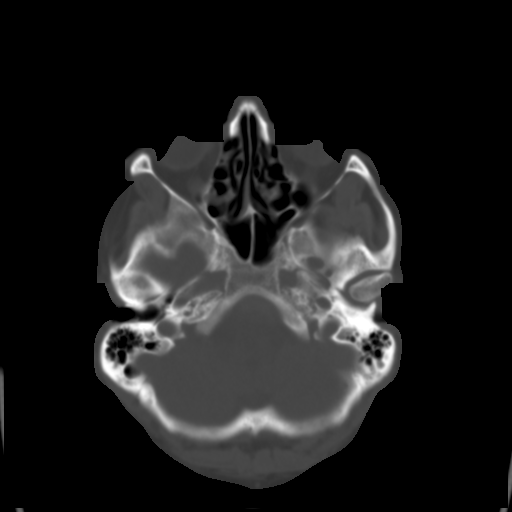
[im 9/35  brain]
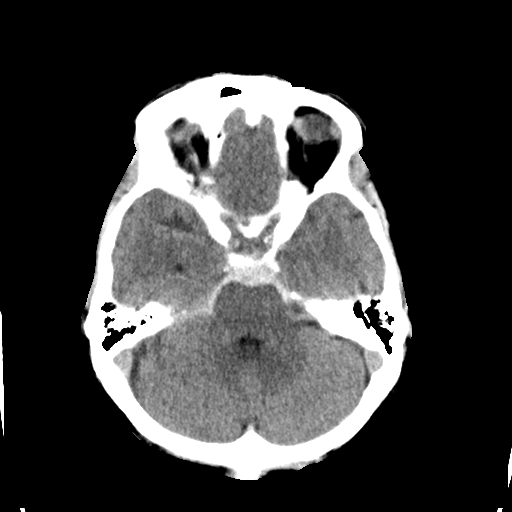
[im 13/35  brain]
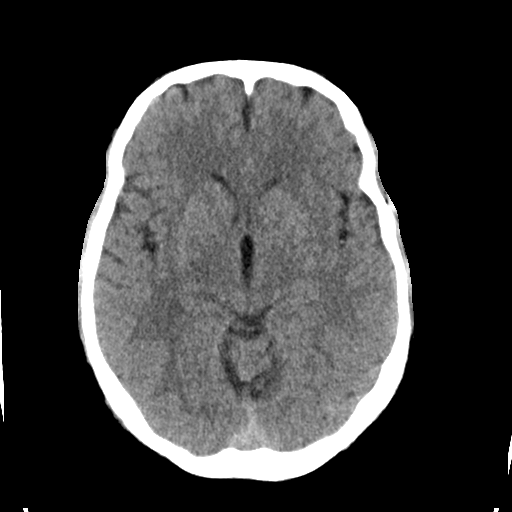
[im 18/35  brain]
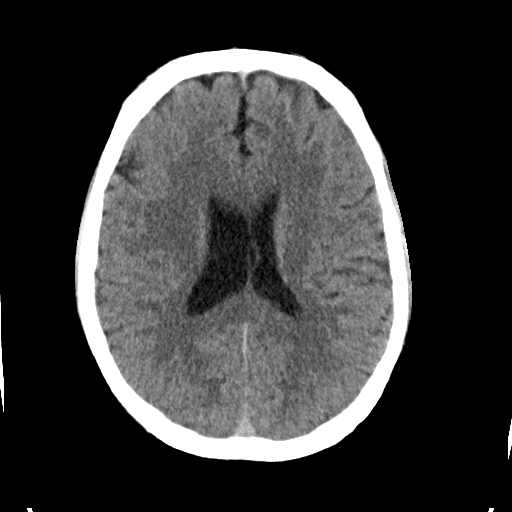
[im 22/35  brain]
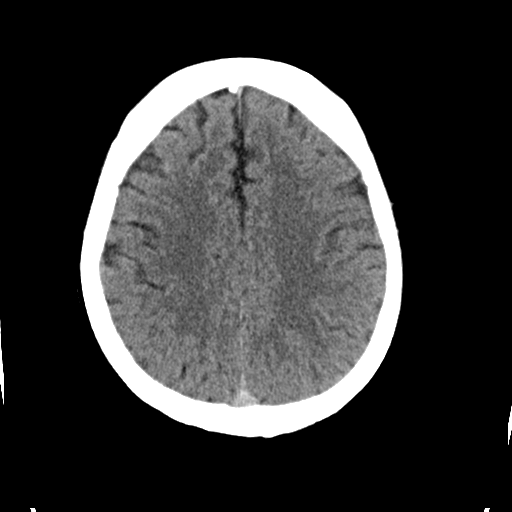
[im 22/35  bone]
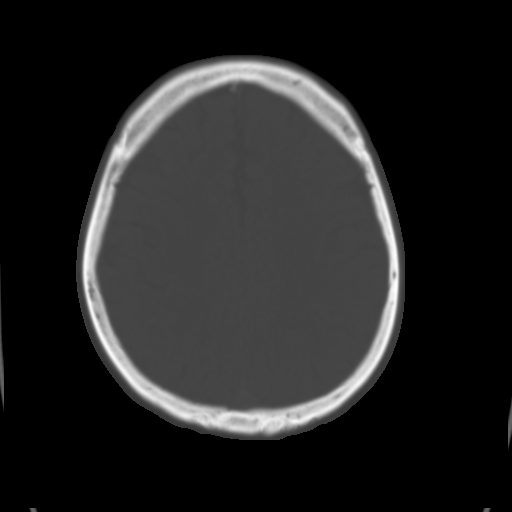
[im 26/35  brain]
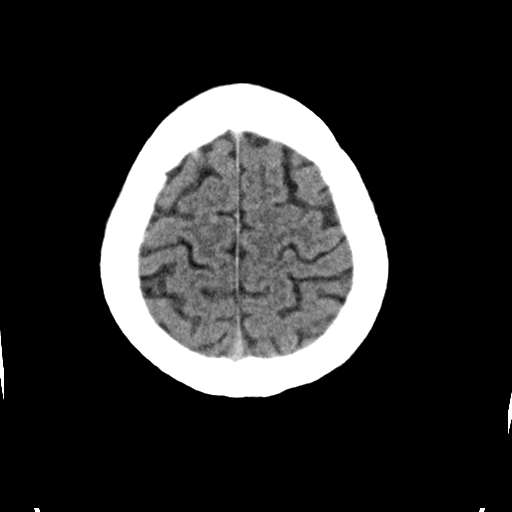
[im 30/35  brain]
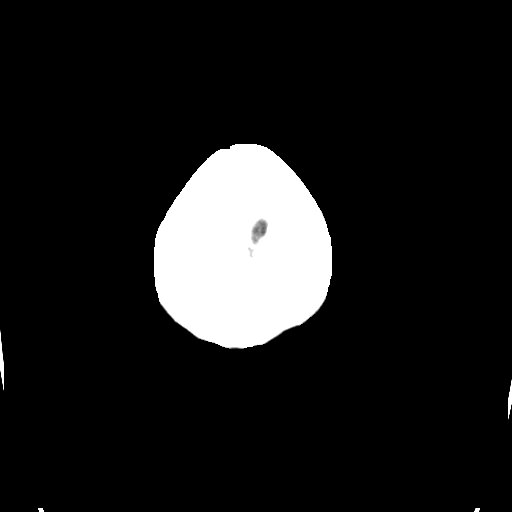

[Series 3: head bone · axial · 0.44mm/px · z∈[-143,-7]mm · 8 of 86 slices shown]
[im 9/86  bone]
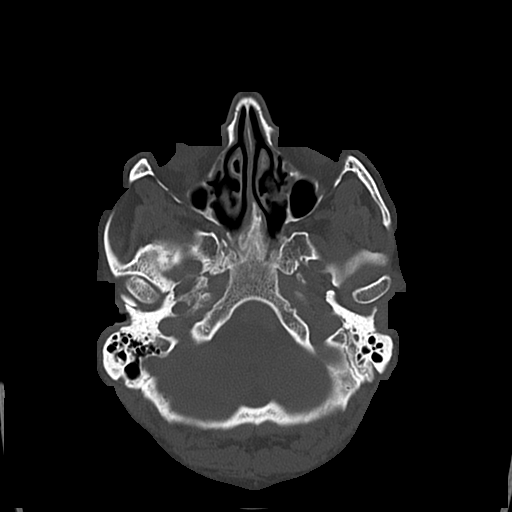
[im 18/86  bone]
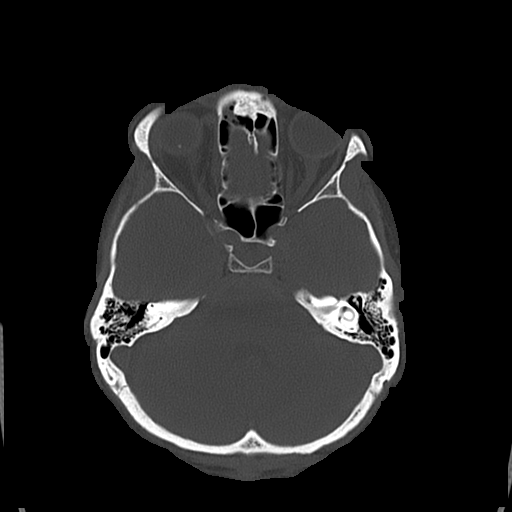
[im 26/86  bone]
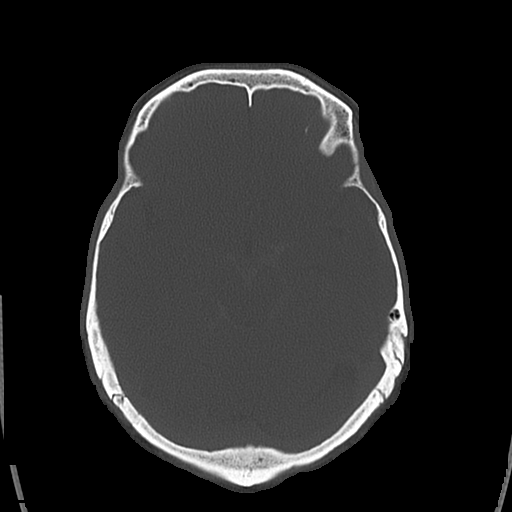
[im 39/86  bone]
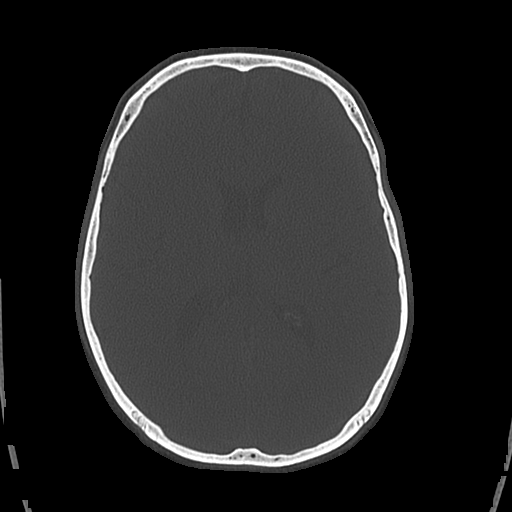
[im 47/86  bone]
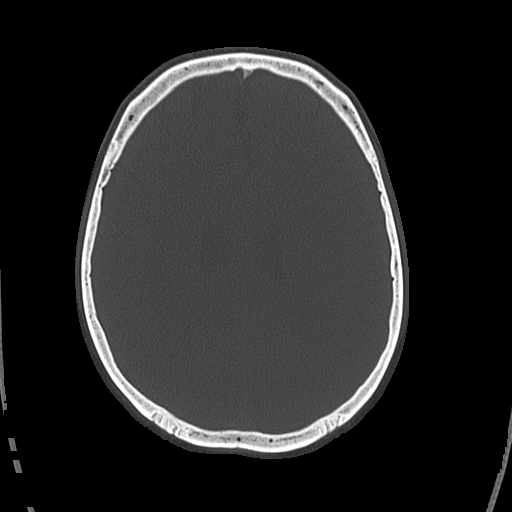
[im 60/86  bone]
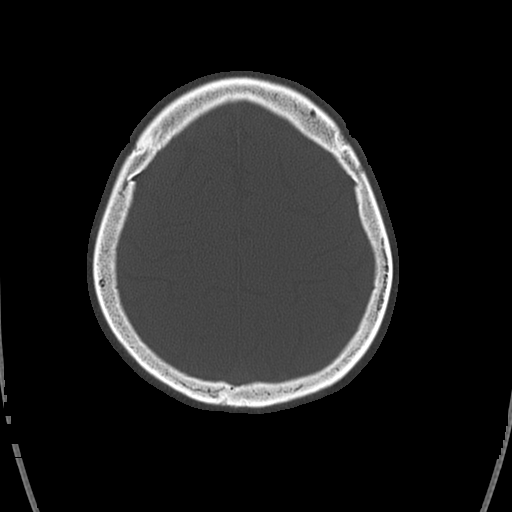
[im 69/86  bone]
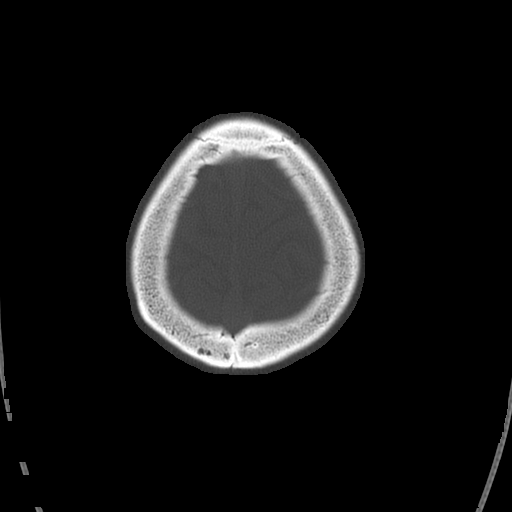
[im 77/86  bone]
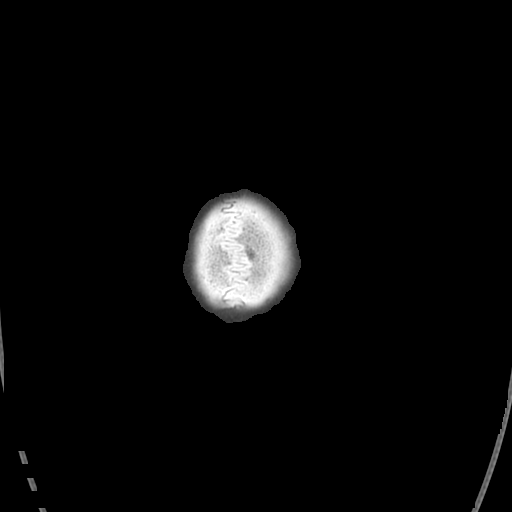

[15 of 30 positions shown; findings below may reference images not displayed]

FINDINGS: Brain:

Cerebral volume is normal for age.

There is moderate volume acute subarachnoid hemorrhage within the
basal cisterns and extending ventral to the brainstem within the
posterior fossa.

No demarcated cortical infarct.

No extra-axial fluid collection.

No evidence of intracranial mass.

No evidence of hydrocephalus at this time.

No midline shift.

Vascular: No hyperdense vessel.

Skull: Normal. Negative for fracture or focal lesion.

Sinuses/Orbits: Visualized orbits show no acute finding. Small
volume frothy secretions within the right frontal and right sphenoid
sinuses. Mild bilateral ethmoid, right sphenoid and bilateral
maxillary sinus mucosal thickening at the imaged levels.

These results were called by telephone at the time of interpretation
on [DATE] at [DATE] to provider LUK , who verbally
acknowledged these results.
IMPRESSION: Moderate volume acute subarachnoid hemorrhage predominantly within
the basal cisterns and extending ventral to the brainstem within the
posterior fossa. The pattern of subarachnoid hemorrhage is highly
suggestive of aneurysm rupture and a CTA head/neck is recommended
for further evaluation.

No evidence of hydrocephalus at this time.

## 2020-10-14 IMAGING — CT CT ANGIO HEAD
1 of 7 series · 4 of 14 positions shown · IV contrast (APPLIED)
Comparison: Noncontrast head CT performed earlier today [DATE].

CLINICAL DATA: Stroke/TIA, assess extracranial arteries.

EXAM:
CT ANGIOGRAPHY HEAD AND NECK
TECHNIQUE: Multidetector CT imaging of the head and neck was performed using
the standard protocol during bolus administration of intravenous
contrast. Multiplanar CT image reconstructions and MIPs were
obtained to evaluate the vascular anatomy. Carotid stenosis
measurements (when applicable) are obtained utilizing NASCET
criteria, using the distal internal carotid diameter as the
denominator.
CONTRAST:  75mL OMNIPAQUE IOHEXOL 350 MG/ML SOLN

[Series 7: cta head & neck · axial · 0.51mm/px · z∈[-297,-61]mm · 4 of 764 slices shown]
[im 153/764  soft-tissue]
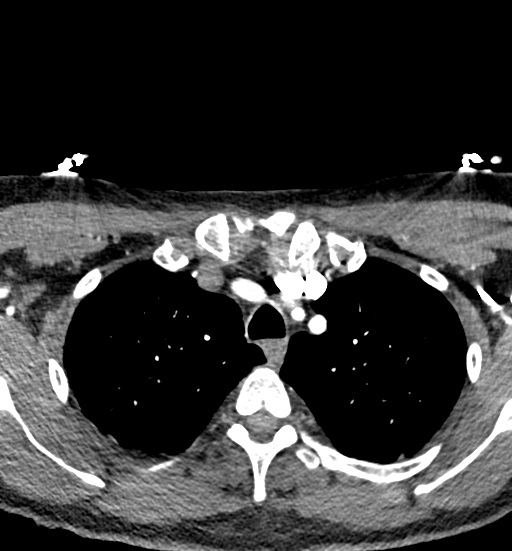
[im 306/764  bone]
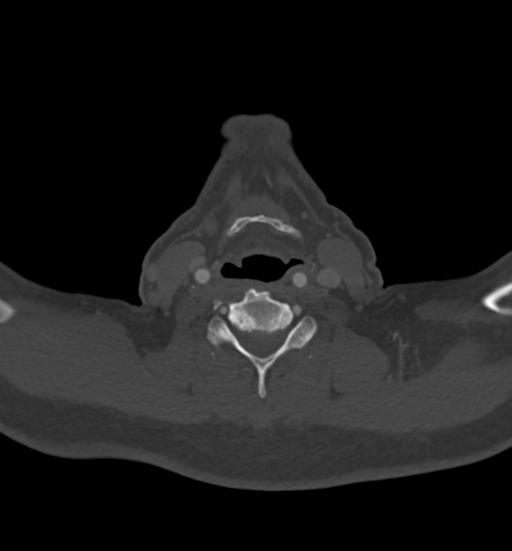
[im 458/764  soft-tissue]
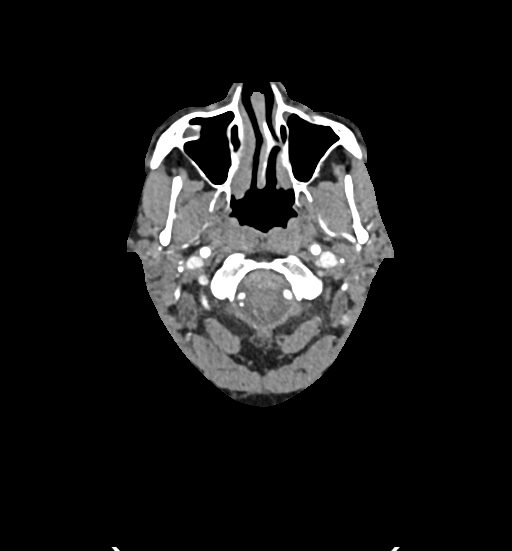
[im 611/764  bone]
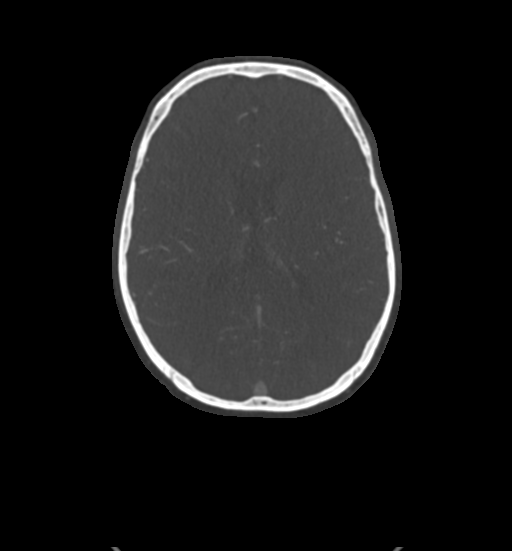

[4 of 14 positions shown; findings below may reference images not displayed]

FINDINGS: CTA NECK FINDINGS

Aortic arch: Standard aortic branching. The visualized aortic arch
is normal in caliber. No hemodynamically significant innominate or
proximal subclavian artery stenosis.

Right carotid system: CCA and ICA patent within the neck without
stenosis. Minimal soft plaque within the proximal ICA.

Left carotid system: CCA and ICA patent within the neck without
stenosis. Minimal soft plaque within the proximal ICA.

Vertebral arteries: Vertebral arteries patent within the neck
without stenosis. Left vertebral artery dominant.

Skeleton: Cervical spondylosis with multilevel disc space narrowing,
disc bulges, endplate spurring and uncovertebral hypertrophy.
Multilevel spinal canal and bony neural foraminal narrowing. No
acute bony abnormality or aggressive osseous lesion.

Other neck: No neck mass or cervical lymphadenopathy. Thyroid
unremarkable.

Upper chest: No consolidation within the imaged lung apices.

Review of the MIP images confirms the above findings

CTA HEAD FINDINGS

Anterior circulation:

The intracranial internal carotid arteries are patent. The M1 middle
cerebral arteries are patent. No M2 proximal branch occlusion or
high-grade proximal stenosis is identified. The anterior cerebral
arteries are patent. No intracranial aneurysm is identified.

Posterior circulation:

The intracranial vertebral arteries are patent. The basilar artery
is patent. The posterior cerebral arteries are patent. Posterior
communicating arteries are hypoplastic or absent bilaterally. No
intracranial aneurysm is identified.

Venous sinuses: Within the limitations of contrast timing, no
convincing thrombus.

Anatomic variants: As described

Review of the MIP images confirms the above findings

These results were called by telephone at the time of interpretation
on [DATE] at [DATE] to provider MARUTA , who verbally
acknowledged these results.
IMPRESSION: CTA neck:

1. The common carotid and internal carotid arteries are patent
within the neck without stenosis. Minimal soft plaque within the
proximal ICAs bilaterally.
2. Vertebral arteries patent within the neck without stenosis.
3. Cervical spondylosis.

CTA head:

1. No intracranial aneurysm is identified. Catheter-based
angiography is recommended for further evaluation.
2. No intracranial large vessel occlusion or proximal high-grade
arterial stenosis.

## 2020-10-14 IMAGING — MR MR HEAD W/O CM
6 of 7 series · 24 of 48 positions shown · non-contrast
Comparison: Noncontrast head CT and CT angiogram head/neck
performed earlier today [DATE].

CLINICAL DATA: Subarachnoid hemorrhage ([place]), follow-up.

EXAM:
MRI HEAD WITHOUT CONTRAST
TECHNIQUE: Multiplanar, multiecho pulse sequences of the brain and surrounding
structures were obtained without intravenous contrast.

[Series 4: T1 · sagittal · 5.0mm · 0.47mm/px · 3 of 25 slices shown]
[im 1/25]
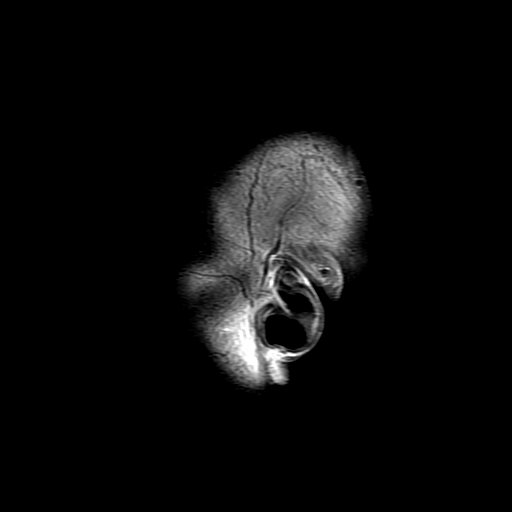
[im 13/25]
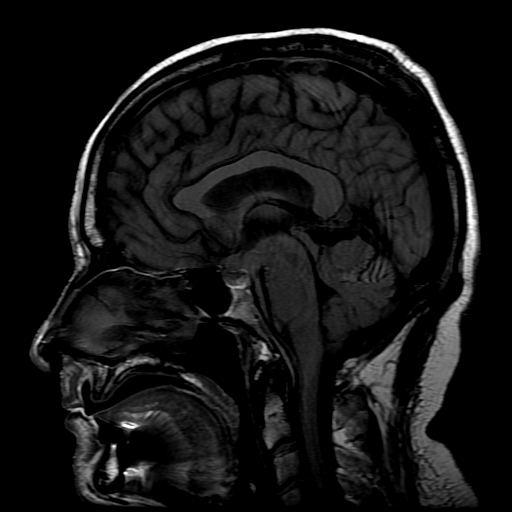
[im 25/25]
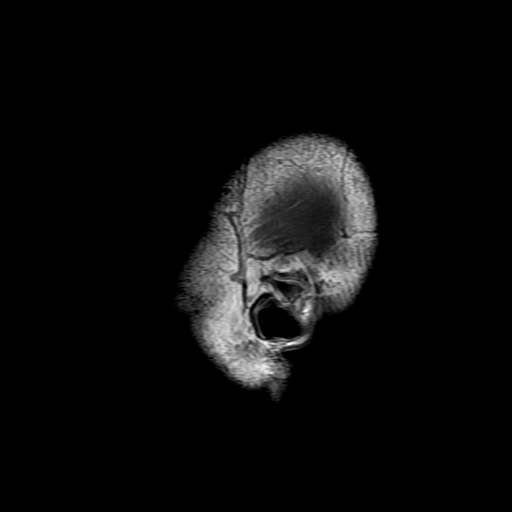

[Series 5: DWI · axial · 5.0mm · 1.09mm/px · z∈[-61,+82]mm · 8 of 60 slices shown]
[im 1/60]
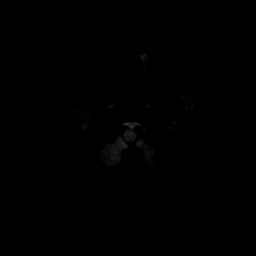
[im 7/60]
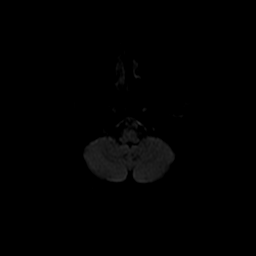
[im 20/60]
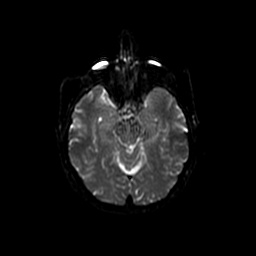
[im 27/60]
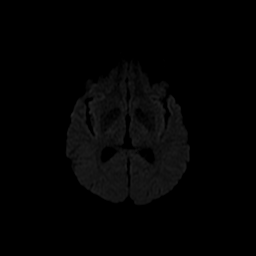
[im 33/60]
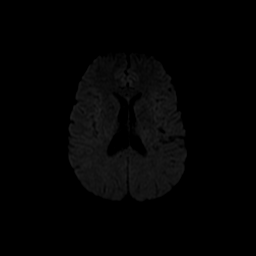
[im 40/60]
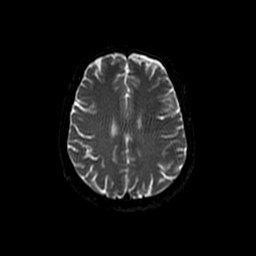
[im 53/60]
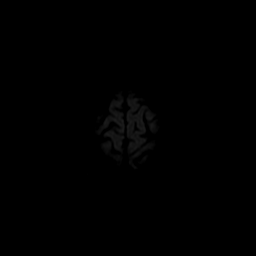
[im 60/60]
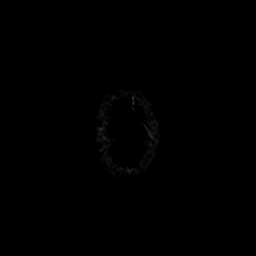

[Series 6: T2 · axial · 5.0mm · 0.43mm/px · z∈[-61,+92]mm · 4 of 23 slices shown (1 of 2)]
[im 1/23]
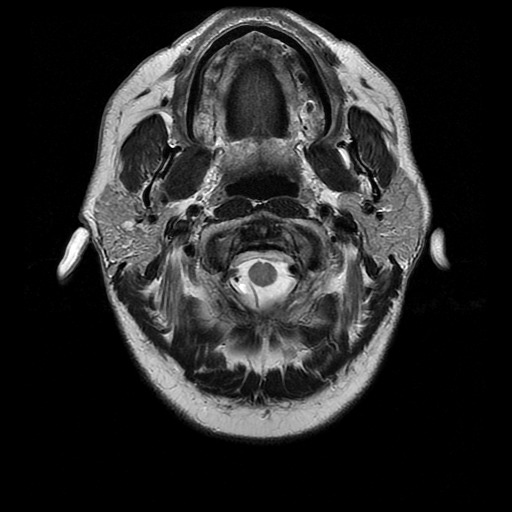
[im 8/23]
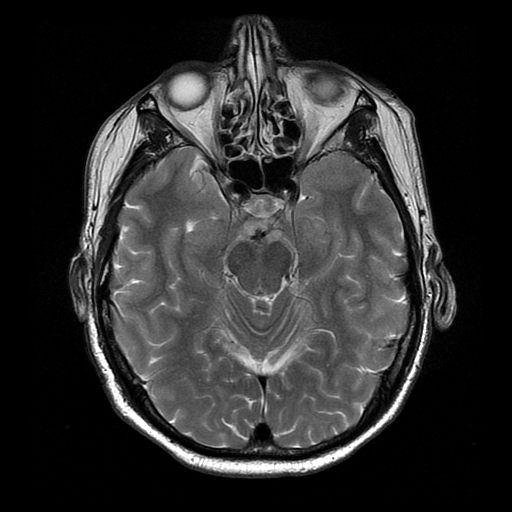
[im 15/23]
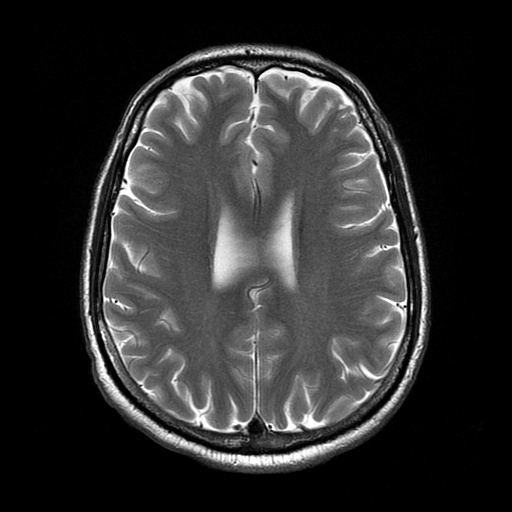
[im 23/23]
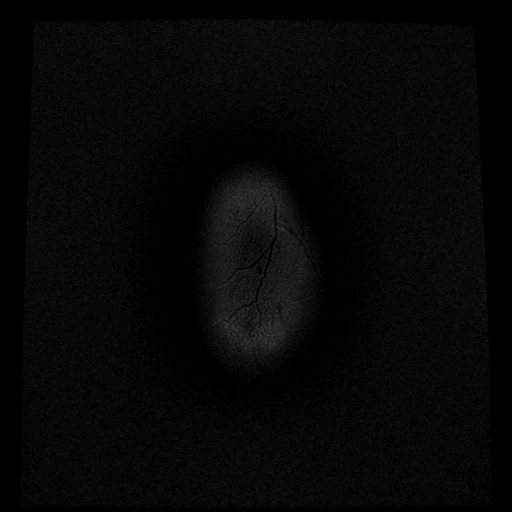

[Series 7: FLAIR · axial · 5.0mm · 0.43mm/px · z∈[-65,+95]mm · 4 of 24 slices shown]
[im 1/24]
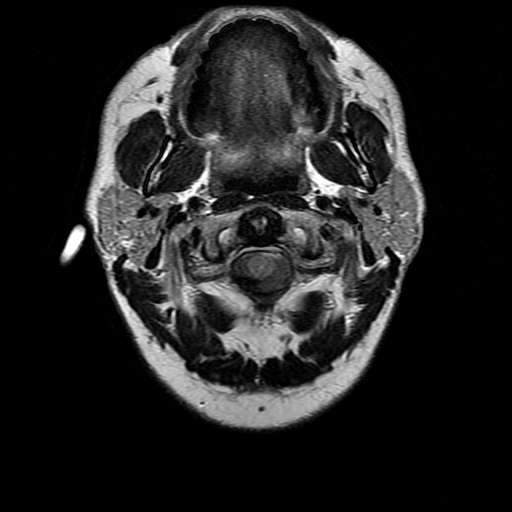
[im 8/24]
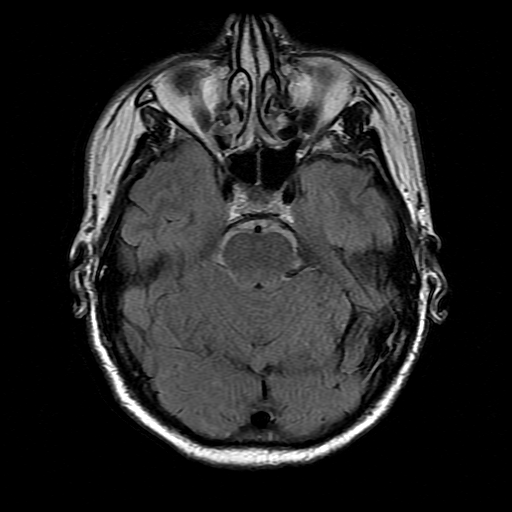
[im 16/24]
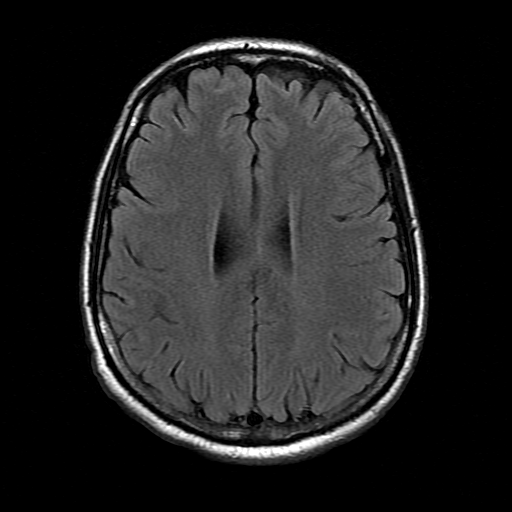
[im 24/24]
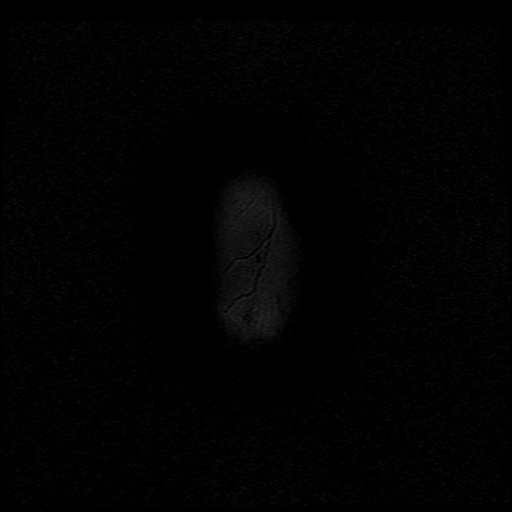

[Series 8: ax mpgr · axial · 5.0mm · 0.43mm/px · 1 of 22 slices shown]
[im 1/22]
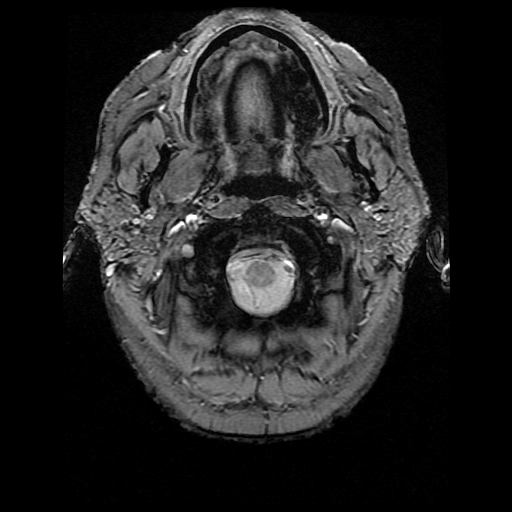

[Series 10: T2 · coronal · 5.0mm · 0.39mm/px · 4 of 27 slices shown (2 of 2)]
[im 1/27]
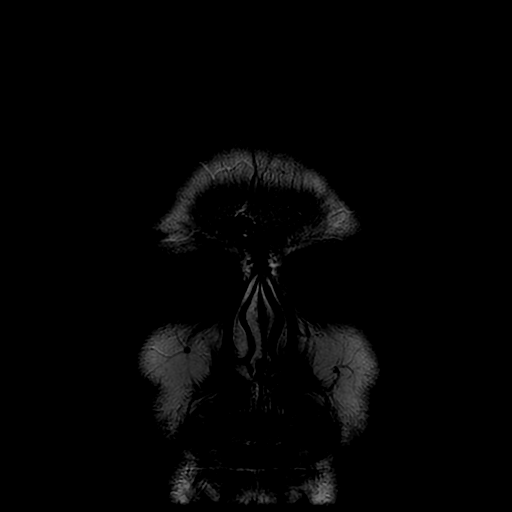
[im 9/27]
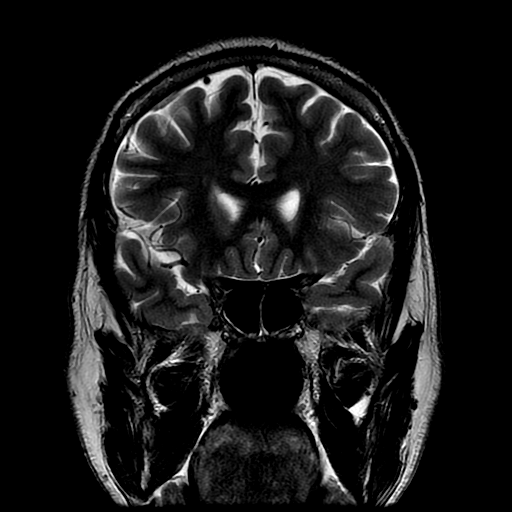
[im 18/27]
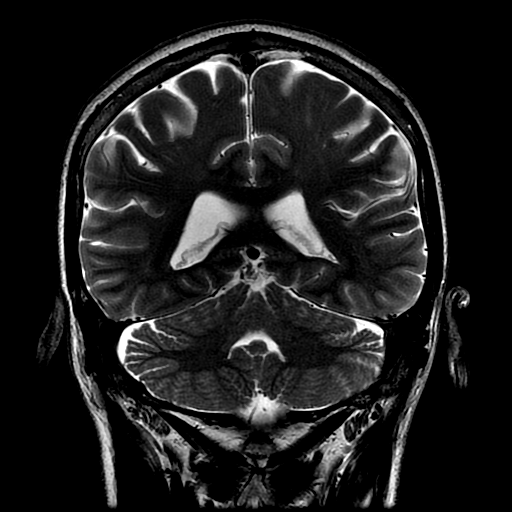
[im 27/27]
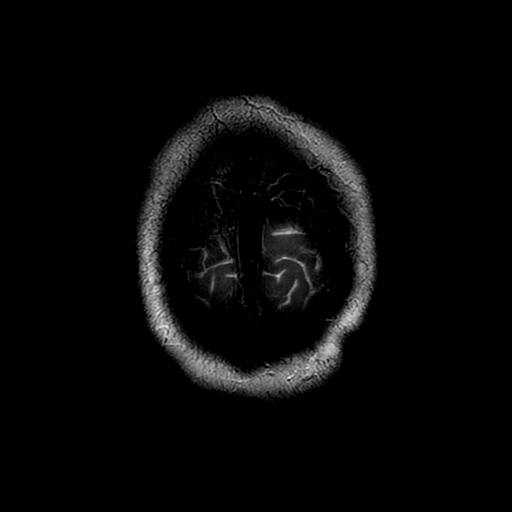

[24 of 48 positions shown; findings below may reference images not displayed]

FINDINGS: Brain:

Cerebral volume is normal for age.

Redemonstrated moderate volume acute subarachnoid hemorrhage most
notably within the basal cisterns and extending ventral to the
brainstem within the posterior fossa. Trace acute subarachnoid
hemorrhage is also present along the posterior aspect of the right
sylvian fissure (series 7, image 14). Subarachnoid hemorrhage also
extends slightly into the ventral aspect of the upper cervical
spinal canal.

No focal parenchymal signal abnormality is identified.

There is no acute infarct.

No evidence of intracranial mass.

No evidence of hydrocephalus

No midline shift.

Vascular: Expected proximal arterial flow voids.

Skull and upper cervical spine: No focal marrow lesion.

Sinuses/Orbits: Visualized orbits show no acute finding. Mild
mucosal thickening within the bilateral ethmoid sinuses. Mild
mucosal thickening within the bilateral maxillary sinuses.
Additionally, a small mucous retention cyst is present within the
right maxillary sinus.
IMPRESSION: Redemonstrated moderate volume acute subarachnoid hemorrhage
predominantly within the basal cisterns and extending ventral to the
brainstem within the posterior fossa. Subarachnoid hemorrhage also
extends slightly into the ventral aspect of the upper cervical
spinal canal. Additional trace subarachnoid hemorrhage along the
posterior aspect of the right sylvian fissure, likely due to
interval redistribution.

The pattern of subarachnoid hemorrhage is again highly suspicious
for aneurysm rupture. Catheter based angiography is recommended for
further evaluation.

No evidence of acute infarction or of hydrocephalus.

Paranasal sinus disease as described

## 2020-10-14 MED ORDER — DOCUSATE SODIUM 100 MG PO CAPS
100.0000 mg | ORAL_CAPSULE | Freq: Two times a day (BID) | ORAL | Status: DC
  Administered 2020-10-15 – 2020-10-20 (×11): 100 mg via ORAL
  Filled 2020-10-14 (×11): qty 1

## 2020-10-14 MED ORDER — CLEVIDIPINE BUTYRATE 0.5 MG/ML IV EMUL
0.0000 mg/h | INTRAVENOUS | Status: DC
  Administered 2020-10-14: 2 mg/h via INTRAVENOUS
  Filled 2020-10-14: qty 50

## 2020-10-14 MED ORDER — METOCLOPRAMIDE HCL 5 MG/ML IJ SOLN: 10.0000 mg | Freq: Once | INTRAMUSCULAR | Status: AC

## 2020-10-14 MED ORDER — PANTOPRAZOLE SODIUM 40 MG PO PACK
40.0000 mg | PACK | Freq: Every day | ORAL | Status: DC
  Filled 2020-10-14: qty 20

## 2020-10-14 MED ORDER — CLEVIDIPINE BUTYRATE 0.5 MG/ML IV EMUL: 0.0000 mg/h | INTRAVENOUS | Status: DC

## 2020-10-14 MED ORDER — METOCLOPRAMIDE HCL 5 MG/ML IJ SOLN
INTRAMUSCULAR | Status: AC
  Administered 2020-10-14: 10 mg via INTRAVENOUS
  Filled 2020-10-14: qty 2

## 2020-10-14 MED ORDER — SODIUM CHLORIDE 0.9 % IV SOLN: INTRAVENOUS | Status: DC

## 2020-10-14 MED ORDER — DEXAMETHASONE SODIUM PHOSPHATE 10 MG/ML IJ SOLN
10.0000 mg | Freq: Once | INTRAMUSCULAR | Status: AC
  Administered 2020-10-14: 10 mg via INTRAVENOUS
  Filled 2020-10-14: qty 1

## 2020-10-14 MED ORDER — ACETAMINOPHEN 160 MG/5ML PO SOLN: 650.0000 mg | ORAL | Status: DC | PRN

## 2020-10-14 MED ORDER — FENTANYL CITRATE (PF) 100 MCG/2ML IJ SOLN: 50.0000 ug | Freq: Once | INTRAMUSCULAR | Status: DC

## 2020-10-14 MED ORDER — CHLORHEXIDINE GLUCONATE CLOTH 2 % EX PADS
6.0000 | MEDICATED_PAD | Freq: Every day | CUTANEOUS | Status: DC
  Administered 2020-10-15 – 2020-10-20 (×5): 6 via TOPICAL

## 2020-10-14 MED ORDER — ONDANSETRON 4 MG PO TBDP: 4.0000 mg | ORAL_TABLET | Freq: Four times a day (QID) | ORAL | Status: DC | PRN

## 2020-10-14 MED ORDER — ACETAMINOPHEN 325 MG PO TABS
650.0000 mg | ORAL_TABLET | ORAL | Status: DC | PRN
  Administered 2020-10-14 – 2020-10-20 (×18): 650 mg via ORAL
  Filled 2020-10-14 (×19): qty 2

## 2020-10-14 MED ORDER — NIMODIPINE 6 MG/ML PO SOLN
60.0000 mg | ORAL | Status: DC
Start: 2020-10-14 — End: 2020-10-21
  Filled 2020-10-14 (×5): qty 10

## 2020-10-14 MED ORDER — LABETALOL HCL 5 MG/ML IV SOLN
20.0000 mg | Freq: Once | INTRAVENOUS | Status: DC
  Filled 2020-10-14: qty 4

## 2020-10-14 MED ORDER — IOHEXOL 350 MG/ML SOLN
75.0000 mL | Freq: Once | INTRAVENOUS | Status: AC | PRN
  Administered 2020-10-14: 75 mL via INTRAVENOUS

## 2020-10-14 MED ORDER — ACETAMINOPHEN 650 MG RE SUPP: 650.0000 mg | RECTAL | Status: DC | PRN

## 2020-10-14 MED ORDER — FENTANYL CITRATE (PF) 100 MCG/2ML IJ SOLN
INTRAMUSCULAR | Status: AC
  Filled 2020-10-14: qty 2

## 2020-10-14 MED ORDER — STROKE: EARLY STAGES OF RECOVERY BOOK
Freq: Once | Status: DC
  Filled 2020-10-14: qty 1

## 2020-10-14 MED ORDER — PANTOPRAZOLE SODIUM 40 MG PO TBEC
40.0000 mg | DELAYED_RELEASE_TABLET | Freq: Every day | ORAL | Status: DC
  Administered 2020-10-14 – 2020-10-20 (×7): 40 mg via ORAL
  Filled 2020-10-14 (×7): qty 1

## 2020-10-14 MED ORDER — NIMODIPINE 30 MG PO CAPS
60.0000 mg | ORAL_CAPSULE | ORAL | Status: DC
Start: 2020-10-14 — End: 2020-10-21
  Administered 2020-10-14 – 2020-10-20 (×38): 60 mg via ORAL
  Filled 2020-10-14 (×39): qty 2

## 2020-10-14 MED ORDER — ONDANSETRON HCL 4 MG/2ML IJ SOLN
4.0000 mg | Freq: Four times a day (QID) | INTRAMUSCULAR | Status: DC | PRN
  Administered 2020-10-18: 4 mg via INTRAVENOUS
  Filled 2020-10-14 (×2): qty 2

## 2020-10-14 NOTE — ED Notes (Signed)
Attempted to give report at this time 

## 2020-10-14 NOTE — ED Notes (Signed)
ED Provider at bedside. 

## 2020-10-14 NOTE — ED Notes (Signed)
Pt transported from [facility] by [facility] headache- CT revealed subarachnoid hemorrhage. GCS 15. 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 Pt is currently on cleviprex. Headache 3/10 now.

## 2020-10-14 NOTE — ED Notes (Signed)
Attempted to given report at this time 

## 2020-10-14 NOTE — H&P (Signed)
Glen MormonMichael E Carter is an 55 y.o. male.   Chief Complaint: Headache. HPI: 55 year old male with sudden onset of upper neck pain with associated headaches.  Patient was at work today when he reported feeling stressed and somewhat angry.  Shortly thereafter he began having severe upper cervical pain which progressed to headache.  No loss of consciousness.  No seizure activity.  No prior history of similar symptoms.  Patient denies any history of trauma.  CT scan done at outside emergency department demonstrated evidence of a basilar subarachnoid hemorrhage without any parenchymal involvement.  No evidence of hydrocephalus.  No evidence of any intraventricular hemorrhage.  CT angiogram negative for vascular malformation or aneurysm.  Past Medical History:  Diagnosis Date  . Migraines     Past Surgical History:  Procedure Laterality Date  . BACK SURGERY      History reviewed. No pertinent family history. Social History:  reports that he has been smoking cigarettes. He has been smoking about 1.00 pack per day. He has never used smokeless tobacco. He reports that he does not drink alcohol. No history on file for drug use.  Allergies: No Known Allergies  (Not in a hospital admission)   Results for orders placed or performed during the hospital encounter of 10/14/20 (from the past 48 hour(s))  CBC with Differential/Platelet     Status: Abnormal   Collection Time: 10/14/20 12:28 PM  Result Value Ref Range   WBC 11.2 (H) 4.0 - 10.5 K/uL   RBC 5.50 4.22 - 5.81 MIL/uL   Hemoglobin 15.7 13.0 - 17.0 g/dL   HCT 16.147.2 09.639.0 - 04.552.0 %   MCV 85.8 80.0 - 100.0 fL   MCH 28.5 26.0 - 34.0 pg   MCHC 33.3 30.0 - 36.0 g/dL   RDW 40.912.2 81.111.5 - 91.415.5 %   Platelets 322 150 - 400 K/uL   nRBC 0.0 0.0 - 0.2 %   Neutrophils Relative % 76 %   Neutro Abs 8.6 (H) 1.7 - 7.7 K/uL   Lymphocytes Relative 12 %   Lymphs Abs 1.3 0.7 - 4.0 K/uL   Monocytes Relative 7 %   Monocytes Absolute 0.7 0.1 - 1.0 K/uL    Eosinophils Relative 3 %   Eosinophils Absolute 0.3 0.0 - 0.5 K/uL   Basophils Relative 1 %   Basophils Absolute 0.1 0.0 - 0.1 K/uL   Immature Granulocytes 1 %   Abs Immature Granulocytes 0.13 (H) 0.00 - 0.07 K/uL    Comment: Performed at [facility]  Comprehensive metabolic panel     Status: Abnormal   Collection Time: 10/14/20 12:28 PM  Result Value Ref Range   Sodium 139 135 - 145 mmol/L   Potassium 4.0 3.5 - 5.1 mmol/L   Chloride 103 98 - 111 mmol/L   CO2 27 22 - 32 mmol/L   Glucose, Bld 162 (H) 70 - 99 mg/dL    Comment: Glucose reference range applies only to samples taken after fasting for at least 8 hours.   BUN 19 6 - 20 mg/dL   Creatinine, Ser 7.821.06 0.61 - 1.24 mg/dL   Calcium 9.0 8.9 - 95.610.3 mg/dL   Total Protein 7.1 6.5 - 8.1 g/dL   Albumin 4.4 3.5 - 5.0 g/dL   AST 14 (L) 15 - 41 U/L   ALT 15 0 - 44 U/L   Alkaline Phosphatase 81 38 - 126 U/L   Total Bilirubin 0.5 0.3 - 1.2 mg/dL   GFR, Estimated >21>60 >30>60 mL/min  Comment: (NOTE) Calculated using the CKD-EPI Creatinine Equation (2021)    Anion gap 9 5 - 15    Comment: Performed at [facility]  Protime-INR     Status: None   Collection Time: 10/14/20 12:28 PM  Result Value Ref Range   Prothrombin Time 12.3 11.4 - 15.2 seconds   INR 1.0 0.8 - 1.2    Comment: (NOTE) INR goal varies based on device and disease states. Performed at [facility]   APTT     Status: None   Collection Time: 10/14/20 12:28 PM  Result Value Ref Range   aPTT 26 24 - 36 seconds    Comment: Performed at [facility]  Resp Panel by RT-PCR (Flu A&B, Covid) Nasopharyngeal Swab     Status: None   Collection Time: 10/14/20 12:28 PM   Specimen: Nasopharyngeal Swab; Nasopharyngeal(NP) swabs in vial transport medium  Result Value Ref Range   SARS Coronavirus 2 by RT PCR NEGATIVE NEGATIVE    Comment: (NOTE) SARS-CoV-2 target nucleic acids are NOT DETECTED.  The SARS-CoV-2 RNA  is generally detectable in upper respiratory specimens during the acute phase of infection. The lowest concentration of SARS-CoV-2 viral copies this assay can detect is 138 copies/mL. A negative result does not preclude SARS-Cov-2 infection and should not be used as the sole basis for treatment or other patient management decisions. A negative result may occur with  improper specimen collection/handling, submission of specimen other than nasopharyngeal swab, presence of viral mutation(s) within the areas targeted by this assay, and inadequate number of viral copies(<138 copies/mL). A negative result must be combined with clinical observations, patient history, and epidemiological information. The expected result is Negative.  Fact Sheet for Patients:  BloggerCourse.com  Fact Sheet for Healthcare Providers:  SeriousBroker.it  This test is no t yet approved or cleared by the [place] FDA and  has been authorized for detection and/or diagnosis of SARS-CoV-2 by FDA under an Emergency Use Authorization (EUA). This EUA will remain  in effect (meaning this test can be used) for the duration of the COVID-19 declaration under Section 564(b)(1) of the Act, 21 U.S.C.section 360bbb-3(b)(1), unless the authorization is terminated  or revoked sooner.       Influenza A by PCR NEGATIVE NEGATIVE   Influenza B by PCR NEGATIVE NEGATIVE    Comment: (NOTE) The Xpert Xpress SARS-CoV-2/FLU/RSV plus assay is intended as an aid in the diagnosis of influenza from Nasopharyngeal swab specimens and should not be used as a sole basis for treatment. Nasal washings and aspirates are unacceptable for Xpert Xpress SARS-CoV-2/FLU/RSV testing.  Fact Sheet for Patients: BloggerCourse.com  Fact Sheet for Healthcare Providers: SeriousBroker.it  This test is not yet approved or cleared by the [place] FDA  and has been authorized for detection and/or diagnosis of SARS-CoV-2 by FDA under an Emergency Use Authorization (EUA). This EUA will remain in effect (meaning this test can be used) for the duration of the COVID-19 declaration under Section 564(b)(1) of the Act, 21 U.S.C. section 360bbb-3(b)(1), unless the authorization is terminated or revoked.  Performed at [facility]    CT Angio Head W or Wo Contrast  Result Date: 10/14/2020 CLINICAL DATA:  Stroke/TIA, assess extracranial arteries. EXAM: CT ANGIOGRAPHY HEAD AND NECK TECHNIQUE: Multidetector CT imaging of the head and neck was performed using the standard protocol during bolus administration of intravenous contrast. Multiplanar CT image reconstructions and MIPs were obtained to evaluate the vascular anatomy. Carotid stenosis  measurements (when applicable) are obtained utilizing NASCET criteria, using the distal internal carotid diameter as the denominator. CONTRAST:  75mL OMNIPAQUE IOHEXOL 350 MG/ML SOLN COMPARISON:  Noncontrast head CT performed earlier today 10/14/2020. FINDINGS: CTA NECK FINDINGS Aortic arch: Standard aortic branching. The visualized aortic arch is normal in caliber. No hemodynamically significant innominate or proximal subclavian artery stenosis. Right carotid system: CCA and ICA patent within the neck without stenosis. Minimal soft plaque within the proximal ICA. Left carotid system: CCA and ICA patent within the neck without stenosis. Minimal soft plaque within the proximal ICA. Vertebral arteries: Vertebral arteries patent within the neck without stenosis. Left vertebral artery dominant. Skeleton: Cervical spondylosis with multilevel disc space narrowing, disc bulges, endplate spurring and uncovertebral hypertrophy. Multilevel spinal canal and bony neural foraminal narrowing. No acute bony abnormality or aggressive osseous lesion. Other neck: No neck mass or cervical lymphadenopathy. Thyroid unremarkable.  Upper chest: No consolidation within the imaged lung apices. Review of the MIP images confirms the above findings CTA HEAD FINDINGS Anterior circulation: The intracranial internal carotid arteries are patent. The M1 middle cerebral arteries are patent. No M2 proximal branch occlusion or high-grade proximal stenosis is identified. The anterior cerebral arteries are patent. No intracranial aneurysm is identified. Posterior circulation: The intracranial vertebral arteries are patent. The basilar artery is patent. The posterior cerebral arteries are patent. Posterior communicating arteries are hypoplastic or absent bilaterally. No intracranial aneurysm is identified. Venous sinuses: Within the limitations of contrast timing, no convincing thrombus. Anatomic variants: As described Review of the MIP images confirms the above findings These results were called by telephone at the time of interpretation on 10/14/2020 at 1:30 pm to provider Lorre Nick , who verbally acknowledged these results. IMPRESSION: CTA neck: 1. The common carotid and internal carotid arteries are patent within the neck without stenosis. Minimal soft plaque within the proximal ICAs bilaterally. 2. Vertebral arteries patent within the neck without stenosis. 3. Cervical spondylosis. CTA head: 1. No intracranial aneurysm is identified. Catheter-based angiography is recommended for further evaluation. 2. No intracranial large vessel occlusion or proximal high-grade arterial stenosis. Electronically Signed   By: Jackey Loge DO   On: 10/14/2020 13:31   CT Head Wo Contrast  Result Date: 10/14/2020 CLINICAL DATA:  Cerebral hemorrhage suspected. Additional history provided: Sudden onset headache at the base of the head, nausea, vomiting, blurred vision, photosensitivity. EXAM: CT HEAD WITHOUT CONTRAST TECHNIQUE: Contiguous axial images were obtained from the base of the skull through the vertex without intravenous contrast. COMPARISON:  No pertinent  prior exams available for comparison. FINDINGS: Brain: Cerebral volume is normal for age. There is moderate volume acute subarachnoid hemorrhage within the basal cisterns and extending ventral to the brainstem within the posterior fossa. No demarcated cortical infarct. No extra-axial fluid collection. No evidence of intracranial mass. No evidence of hydrocephalus at this time. No midline shift. Vascular: No hyperdense vessel. Skull: Normal. Negative for fracture or focal lesion. Sinuses/Orbits: Visualized orbits show no acute finding. Small volume frothy secretions within the right frontal and right sphenoid sinuses. Mild bilateral ethmoid, right sphenoid and bilateral maxillary sinus mucosal thickening at the imaged levels. These results were called by telephone at the time of interpretation on 10/14/2020 at 12:47 pm to provider Lorre Nick , who verbally acknowledged these results. IMPRESSION: Moderate volume acute subarachnoid hemorrhage predominantly within the basal cisterns and extending ventral to the brainstem within the posterior fossa. The pattern of subarachnoid hemorrhage is highly suggestive of aneurysm rupture and a CTA head/neck is  recommended for further evaluation. No evidence of hydrocephalus at this time. Electronically Signed   By: Jackey Loge DO   On: 10/14/2020 12:49   CT Angio Neck W and/or Wo Contrast  Result Date: 10/14/2020 CLINICAL DATA:  Stroke/TIA, assess extracranial arteries. EXAM: CT ANGIOGRAPHY HEAD AND NECK TECHNIQUE: Multidetector CT imaging of the head and neck was performed using the standard protocol during bolus administration of intravenous contrast. Multiplanar CT image reconstructions and MIPs were obtained to evaluate the vascular anatomy. Carotid stenosis measurements (when applicable) are obtained utilizing NASCET criteria, using the distal internal carotid diameter as the denominator. CONTRAST:  61mL OMNIPAQUE IOHEXOL 350 MG/ML SOLN COMPARISON:  Noncontrast head CT  performed earlier today 10/14/2020. FINDINGS: CTA NECK FINDINGS Aortic arch: Standard aortic branching. The visualized aortic arch is normal in caliber. No hemodynamically significant innominate or proximal subclavian artery stenosis. Right carotid system: CCA and ICA patent within the neck without stenosis. Minimal soft plaque within the proximal ICA. Left carotid system: CCA and ICA patent within the neck without stenosis. Minimal soft plaque within the proximal ICA. Vertebral arteries: Vertebral arteries patent within the neck without stenosis. Left vertebral artery dominant. Skeleton: Cervical spondylosis with multilevel disc space narrowing, disc bulges, endplate spurring and uncovertebral hypertrophy. Multilevel spinal canal and bony neural foraminal narrowing. No acute bony abnormality or aggressive osseous lesion. Other neck: No neck mass or cervical lymphadenopathy. Thyroid unremarkable. Upper chest: No consolidation within the imaged lung apices. Review of the MIP images confirms the above findings CTA HEAD FINDINGS Anterior circulation: The intracranial internal carotid arteries are patent. The M1 middle cerebral arteries are patent. No M2 proximal branch occlusion or high-grade proximal stenosis is identified. The anterior cerebral arteries are patent. No intracranial aneurysm is identified. Posterior circulation: The intracranial vertebral arteries are patent. The basilar artery is patent. The posterior cerebral arteries are patent. Posterior communicating arteries are hypoplastic or absent bilaterally. No intracranial aneurysm is identified. Venous sinuses: Within the limitations of contrast timing, no convincing thrombus. Anatomic variants: As described Review of the MIP images confirms the above findings These results were called by telephone at the time of interpretation on 10/14/2020 at 1:30 pm to provider Lorre Nick , who verbally acknowledged these results. IMPRESSION: CTA neck: 1. The common  carotid and internal carotid arteries are patent within the neck without stenosis. Minimal soft plaque within the proximal ICAs bilaterally. 2. Vertebral arteries patent within the neck without stenosis. 3. Cervical spondylosis. CTA head: 1. No intracranial aneurysm is identified. Catheter-based angiography is recommended for further evaluation. 2. No intracranial large vessel occlusion or proximal high-grade arterial stenosis. Electronically Signed   By: Jackey Loge DO   On: 10/14/2020 13:31    Pertinent items noted in HPI and remainder of comprehensive ROS otherwise negative.  Blood pressure 135/88, pulse 87, temperature 98.7 F (37.1 C), temperature source Oral, resp. rate 15, height 5\' 5"  (1.651 m), weight 63.5 kg, SpO2 98 %.  Patient is awake and alert.  He is oriented and appropriate.  His speech is fluent.  His judgment insight are intact.  Cranial nerve function normal bilateral.  Motor examination 5/5 bilaterally.  No pronator drift.  Sensory examination intact.  Reflexes normal peer examination head ears eyes nose and throat is unremarked.  Chest and abdomen are benign.  Extremities are free from injury or deformity. Assessment/Plan Grade 1 subarachnoid hemorrhage without aneurysm or vascular malformation on CT angiogram.  Plan admission ICU.  Check MRI scan of brain and cervical spine.  I discussed situation Dr. Conchita Paris.  He will assume care.  He will make a final decision with regard to possible arteriogram in a few days for complete work-up.`  Temple Pacini 10/14/2020, 4:16 PM

## 2020-10-14 NOTE — ED Notes (Signed)
Pat to MRI at this time 

## 2020-10-14 NOTE — ED Provider Notes (Signed)
  Physical Exam  BP 132/86 (BP Location: Right Arm)   Pulse 93   Temp 98 F (36.7 C) (Oral)   Resp 18   Ht 5\' 5"  (1.651 m)   Wt 63.5 kg   SpO2 97%   BMI 23.30 kg/m   Physical Exam Vitals and nursing note reviewed.  Constitutional:      General: He is not in acute distress.    Appearance: He is not ill-appearing, toxic-appearing or diaphoretic.  HENT:     Head: Normocephalic and atraumatic.  Eyes:     General: No scleral icterus.       Right eye: No discharge.        Left eye: No discharge.  Cardiovascular:     Rate and Rhythm: Normal rate.     Heart sounds: Normal heart sounds.  Pulmonary:     Effort: Pulmonary effort is normal. No respiratory distress.     Breath sounds: Normal breath sounds.  Musculoskeletal:     Cervical back: Neck supple.  Skin:    General: Skin is warm and dry.  Neurological:     General: No focal deficit present.     Mental Status: He is alert.     GCS: GCS eye subscore is 4. GCS verbal subscore is 5. GCS motor subscore is 6.     Cranial Nerves: No facial asymmetry.     Comments: Patient able to move all extremities without difficulty  Psychiatric:        Behavior: Behavior is cooperative.     ED Course/Procedures     Procedures  MDM   Patient was transferred to was [facility] emergency department from [facility].  Patient presented there with sudden onset of occipital headache began this morning.  Patient had associated nausea and photophobia.  No focal neurological deficits were noted on patient's initial exam.    Noncontrast head CT showed volume acute subarachnoid hemorrhage predominantly within the basal cisterns and extending ventral to the brainstem within the posterior fossa.   CTA head showed no intracranial aneurysm, large vessel occlusion or proximal high-grade arterial stenosis. CTA neck showed patent common and internal carotid arteries.  Minimal soft plaque within the proximal ICAs bilaterally.  Vertebral arteries  patent within the neck without stenosis.  Previous provider [facility]. [facility] spoke to interventional neurosurgeon Dr. Freida Busman aggressive patient transfer to [facility] emergency department.  He is to be informed upon patient's arrival.  On my interview patient is alert and oriented in no acute distress, nontoxic-appearing.  Patient denies any headache at present.  Patient denies any focal neurological deficits.  Hemodynamically stable at this time.  Consult was placed to [facility]. [facility].    1511 Spoke to on call neurosurgeon Dr. Conchita Paris with neurosurgery who will see the patient for admission.       Jordan Likes, PA-C 10/14/20 1730    10/16/20, MD 10/15/20 1024

## 2020-10-14 NOTE — ED Triage Notes (Signed)
Pt c/o headache to base of neck x30 mins. States feels like his neck is swelling. Pt became nausea and pale during triage. Pt denies injuries or blurred vision.

## 2020-10-14 NOTE — ED Triage Notes (Signed)
Pt reports that he went to work this am and had a sudden headache located at the base of his head. Nausea, but denies any vomiting or blurred vision. Pt has photo sensitivity.  Pt ambulatory to room. Pt seen at the [facility] and sent here for further eval and possible CT scan.

## 2020-10-14 NOTE — ED Provider Notes (Signed)
[facility] EMERGENCY DEPT Provider Note   CSN: [id number] Arrival date & time: 10/14/20  1150     History Chief Complaint  Patient presents with  . Headache    Glen Carter is a 55 y.o. male.  55 year old male who presents with sudden onset of occipital headache since this morning.  Did have nausea initially but none now.  Does have photophobia.  Denies any phonophobia.  Remote history of migraines in the past but nothing like this.  Denies any fever or chills.  No neck pain.  Denies any ataxia.  No recent medication use.  Took naproxen without relief.  Went to urgent care and sent here for further evaluation        Past Medical History:  Diagnosis Date  . Migraines     There are no problems to display for this patient.   Past Surgical History:  Procedure Laterality Date  . BACK SURGERY         History reviewed. No pertinent family history.  Social History   Tobacco Use  . Smoking status: Current Every Day Smoker    Packs/day: 1.00    Types: Cigarettes  . Smokeless tobacco: Never Used  Substance Use Topics  . Alcohol use: No    Home Medications Prior to Admission medications   Not on File    Allergies    Patient has no known allergies.  Review of Systems   Review of Systems  All other systems reviewed and are negative.   Physical Exam Updated Vital Signs BP (!) 163/92 (BP Location: Right Arm)   Pulse 76   Temp 97.7 F (36.5 C) (Oral)   Resp 18   Ht 1.651 m (5\' 5" )   Wt 63.5 kg   SpO2 100%   BMI 23.30 kg/m   Physical Exam Vitals and nursing note reviewed.  Constitutional:      General: He is not in acute distress.    Appearance: Normal appearance. He is well-developed. He is not toxic-appearing.  HENT:     Head: Normocephalic and atraumatic.  Eyes:     General: Lids are normal.     Conjunctiva/sclera: Conjunctivae normal.     Pupils: Pupils are equal, round, and reactive to light.  Neck:     Thyroid: No  thyroid mass.     Trachea: No tracheal deviation.  Cardiovascular:     Rate and Rhythm: Normal rate and regular rhythm.     Heart sounds: Normal heart sounds. No murmur heard. No gallop.   Pulmonary:     Effort: Pulmonary effort is normal. No respiratory distress.     Breath sounds: Normal breath sounds. No stridor. No decreased breath sounds, wheezing, rhonchi or rales.  Abdominal:     General: Bowel sounds are normal. There is no distension.     Palpations: Abdomen is soft.     Tenderness: There is no abdominal tenderness. There is no rebound.  Musculoskeletal:        General: No tenderness. Normal range of motion.     Cervical back: Normal range of motion and neck supple.  Skin:    General: Skin is warm and dry.     Findings: No abrasion or rash.  Neurological:     General: No focal deficit present.     Mental Status: He is alert and oriented to person, place, and time.     GCS: GCS eye subscore is 4. GCS verbal subscore is 5. GCS motor subscore is 6.  Cranial Nerves: Cranial nerves are intact. No cranial nerve deficit.     Sensory: No sensory deficit.     Motor: Motor function is intact.     Coordination: Coordination is intact.  Psychiatric:        Speech: Speech normal.        Behavior: Behavior normal.     ED Results / Procedures / Treatments   Labs (all labs ordered are listed, but only abnormal results are displayed) Labs Reviewed - No data to display  EKG None ED ECG REPORT   Date: 10/14/2020  Rate: 70  Rhythm: normal sinus rhythm  QRS Axis: normal  Intervals: normal  ST/T Wave abnormalities: normal  Conduction Disutrbances:none  Narrative Interpretation:   Old EKG Reviewed: none available  I have personally reviewed the EKG tracing and agree with the computerized printout as noted.  Radiology No results found.  Procedures Procedures   Medications Ordered in ED Medications  metoCLOPramide (REGLAN) injection 10 mg (has no administration in  time range)  dexamethasone (DECADRON) injection 10 mg (has no administration in time range)    ED Course  I have reviewed the triage vital signs and the nursing notes.  Pertinent labs & imaging results that were available during my care of the patient were reviewed by me and considered in my medical decision making (see chart for details).    MDM Rules/Calculators/A&P                         Patient was given Reglan and Decadron for his headache.  CT of head positive for subarachnoid hemorrhage.  Patient blood pressure here stable and not requiring active management this time.  Discussed with neurology at [facility] who recommends speaking with the interventional neurosurgeon.  Spoke with Dr. Conchita Paris and patient will have CT angio of head and neck here and be transported to [facility].  Discussed with EDP, Dr. Criss Alvine, who is accepted the transfer and is aware to call Dr. Conchita Paris on patient arrival  CRITICAL CARE Performed by: Toy Baker Total critical care time: 50 minutes Critical care time was exclusive of separately billable procedures and treating other patients. Critical care was necessary to treat or prevent imminent or life-threatening deterioration. Critical care was time spent personally by me on the following activities: development of treatment plan with patient and/or surrogate as well as nursing, discussions with consultants, evaluation of patient's response to treatment, examination of patient, obtaining history from patient or surrogate, ordering and performing treatments and interventions, ordering and review of laboratory studies, ordering and review of radiographic studies, pulse oximetry and re-evaluation of patient's condition.  Final Clinical Impression(s) / ED Diagnoses Final diagnoses:  None    Rx / DC Orders ED Discharge Orders    None       Lorre Nick, MD 10/14/20 1250

## 2020-10-14 NOTE — ED Triage Notes (Signed)
Pt BIBA from [facility] with a Subarachnoid brain bleed. Pt presented to [facility] with sudden onset of head pain at 10am. Denies trauma. Pt became hypertensive en route with systolic pressures 170/100. Pt started on Cleviprex at 2mg . BP 132/86 currently. Pain 3/10 currently. NIH 0. GCS 15.

## 2020-10-14 NOTE — ED Notes (Signed)
Cleviprex sent with the [facility] as per EDP's order.

## 2020-10-14 NOTE — ED Provider Notes (Signed)
[facility]    CSN: [id number] Arrival date & time: 10/14/20  1055      History   Chief Complaint Chief Complaint  Patient presents with  . Headache    HPI Glen Carter is a 55 y.o. male.   HPI  Patient presents today accompanied by wife with minimum medical history with acute onset occipital headache.  Patient denies head injury.  Headache came on abruptly and was initially a 10 out of 10 and is currently an 8 out of 10.  He also complains of dizziness, nausea and is extremely pale.  Patient reports he has not eaten much today however blood sugar was 159.  He also has 2 elevated blood pressure readings x2 readings highest was 171/90 without a known history of hypertension.  History reviewed. No pertinent past medical history.  There are no problems to display for this patient.   Past Surgical History:  Procedure Laterality Date  . BACK SURGERY         Home Medications    Prior to Admission medications   Not on File    Family History History reviewed. No pertinent family history.  Social History Social History   Tobacco Use  . Smoking status: Current Every Day Smoker    Packs/day: 1.00    Types: Cigarettes  . Smokeless tobacco: Never Used  Substance Use Topics  . Alcohol use: No     Allergies   Patient has no known allergies.   Review of Systems Review of Systems Pertinent negatives listed in HPI   Physical Exam Triage Vital Signs ED Triage Vitals  Enc Vitals Group     BP 10/14/20 1111 (!) 171/90     Pulse Rate 10/14/20 1111 75     Resp 10/14/20 1111 18     Temp 10/14/20 1111 98 F (36.7 C)     Temp Source 10/14/20 1111 Oral     SpO2 10/14/20 1111 97 %     Weight --      Height --      Head Circumference --      Peak Flow --      Pain Score 10/14/20 1112 8     Pain Loc --      Pain Edu? --      Excl. in GC? --    No data found.  Updated Vital Signs BP (!) 158/101 (BP Location: Right Arm)   Pulse 75   Temp  98 F (36.7 C) (Oral)   Resp 18   SpO2 97%   Visual Acuity Right Eye Distance:   Left Eye Distance:   Bilateral Distance:    Right Eye Near:   Left Eye Near:    Bilateral Near:     Physical Exam Constitutional:      General: He is awake.     Appearance: He is ill-appearing.  Eyes:     General: Lids are normal.     Extraocular Movements: Extraocular movements intact.     Right eye: Normal extraocular motion and no nystagmus.     Left eye: Normal extraocular motion and no nystagmus.  Cardiovascular:     Rate and Rhythm: Normal rate and regular rhythm.  Pulmonary:     Effort: Pulmonary effort is normal.     Breath sounds: Normal breath sounds and air entry.  Skin:    General: Skin is dry.     Capillary Refill: Capillary refill takes 2 to 3 seconds.     Coloration:  Skin is pale.  Neurological:     Mental Status: He is alert and oriented to person, place, and time.     Cranial Nerves: Cranial nerves are intact.     Motor: Tremor present.     Coordination: Coordination is intact.     Gait: Gait is intact.     Comments: Bilateral tremulousness of her extremities. 5/5 BLE strength intact      UC Treatments / Results  Labs (all labs ordered are listed, but only abnormal results are displayed) Labs Reviewed  POCT FASTING CBG [facility] MANUAL ENTRY - Abnormal; Notable for the following components:      Result Value   POCT Glucose ([facility]) 159 (*)    All other components within normal limits    EKG   Radiology No results found.  Procedures Procedures (including critical care time)  Medications Ordered in UC Medications - No data to display  Initial Impression / Assessment and Plan / UC Course  I have reviewed the triage vital signs and the nursing notes.  Pertinent labs & imaging results that were available during my care of the patient were reviewed by me and considered in my medical decision making (see chart for details).      Patient presents today accompanied  by his wife with an acute onset headache which was initially 10 out of 10 at the outside of 10.  Blood pressure is also elevated and blood sugar is elevated and patient reports has not eaten any foods at all today.  Given limitation of the urgent care setting patient has been referred to follow-up at the emergency department for further work-up and evaluation to rule out any acute neurovascular syndrome as a source of current symptoms.  Of note patient has not had a complete physical exam in 3 years therefore it is unknown if elevated blood pressure today is related to a diagnosis of hypertension and or if blood sugar is related to diabetes.  Patient and spouse verbalized understanding and agreement with today's plan.  Final Clinical Impressions(s) / UC Diagnoses   Final diagnoses:  Occipital headache  Dizziness and giddiness  Pale     Discharge Instructions     Go immediately to drive [facility] emergency department for further work-up and evaluation of the source of your symptoms that you are presenting here with today.    ED Prescriptions    None     PDMP not reviewed this encounter.   Bing Neighbors, FNP 10/14/20 1137

## 2020-10-14 NOTE — Discharge Instructions (Signed)
Go immediately to drive [facility] emergency department for further work-up and evaluation of the source of your symptoms that you are presenting here with today.

## 2020-10-15 ENCOUNTER — Inpatient Hospital Stay (HOSPITAL_COMMUNITY): Payer: Commercial Managed Care - PPO

## 2020-10-15 DIAGNOSIS — I609 Nontraumatic subarachnoid hemorrhage, unspecified: Secondary | ICD-10-CM

## 2020-10-15 LAB — RAPID URINE DRUG SCREEN, HOSP PERFORMED
Amphetamines: NOT DETECTED
Barbiturates: NOT DETECTED
Benzodiazepines: NOT DETECTED
Cocaine: NOT DETECTED
Opiates: NOT DETECTED
Tetrahydrocannabinol: NOT DETECTED

## 2020-10-15 LAB — HEMOGLOBIN A1C
Hgb A1c MFr Bld: 6.6 % — ABNORMAL HIGH (ref 4.8–5.6)
Mean Plasma Glucose: 142.72 mg/dL

## 2020-10-15 LAB — MRSA PCR SCREENING: MRSA by PCR: NEGATIVE

## 2020-10-15 LAB — HIV ANTIBODY (ROUTINE TESTING W REFLEX): HIV Screen 4th Generation wRfx: NONREACTIVE

## 2020-10-15 MED ORDER — LEVETIRACETAM 500 MG PO TABS
500.0000 mg | ORAL_TABLET | Freq: Two times a day (BID) | ORAL | Status: DC
  Administered 2020-10-15 – 2020-10-20 (×11): 500 mg via ORAL
  Filled 2020-10-15 (×11): qty 1

## 2020-10-15 MED ORDER — OXYCODONE HCL 5 MG PO TABS
5.0000 mg | ORAL_TABLET | ORAL | Status: DC | PRN
  Administered 2020-10-15 – 2020-10-20 (×15): 5 mg via ORAL
  Filled 2020-10-15 (×16): qty 1

## 2020-10-15 NOTE — Evaluation (Addendum)
Occupational Therapy Evaluation Patient Details Name: Glen Carter MRN: [id number] DOB: 01/10/1966 Today's Date: 10/15/2020    History of Present Illness Pt is a 55 y.o. M admitted 3/29 with sudden onset of upper neck pain wtih associated headaches. CTA showing grade 1 SAH without aneurysm or vascular malformation. No significant PMH.   Clinical Impression   Patient evaluated by Occupational Therapy with no further acute OT needs identified. Pt provided glasses for light sensitivity. Pt educated on recovery with quiet times to allow brain to heal and decreased auditory sound during those times. Pt wanting to rest is normal healing for the brain. Advised to discuss with MD teams regarding return to work but slow progression would be recommended from therapy standpoint.  All education has been completed and the patient has no further questions. See below for any follow-up Occupational Therapy or equipment needs. OT to sign off. Thank you for referral.      Follow Up Recommendations  No OT follow up    Equipment Recommendations  None recommended by OT    Recommendations for Other Services       Precautions / Restrictions Precautions Precautions: None Restrictions Weight Bearing Restrictions: No      Mobility Bed Mobility Overal bed mobility: Independent                  Transfers Overall transfer level: Independent Equipment used: None                  Balance Overall balance assessment: No apparent balance deficits (not formally assessed)                               Standardized Balance Assessment Standardized Balance Assessment : Dynamic Gait Index   Dynamic Gait Index Level Surface: Normal Change in Gait Speed: Normal Gait with Horizontal Head Turns: Normal Gait with Vertical Head Turns: Normal Gait and Pivot Turn: Normal Step Over Obstacle: Normal Step Around Obstacles: Normal Steps: Normal Total Score: 24     ADL either  performed or assessed with clinical judgement   ADL Overall ADL's : Needs assistance/impaired Eating/Feeding: Set up Eating/Feeding Details (indicate cue type and reason): due to hand tremor     Upper Body Bathing: Set up   Lower Body Bathing: Set up           Toilet Transfer: Modified Independent           Functional mobility during ADLs: Modified independent General ADL Comments: pt reports that hands are much different than baseline and are worse now then earlier     Vision   Vision Assessment?: Vision impaired- to be further tested in functional context Additional Comments: light sensitive at this time. given sunglasses to help and reprots "much better"     Perception     Praxis      Pertinent Vitals/Pain Pain Assessment: 0-10 Pain Score: 3  Pain Location: headache Pain Descriptors / Indicators: Headache Pain Intervention(s): Monitored during session;Repositioned     Hand Dominance Right   Extremity/Trunk Assessment Upper Extremity Assessment Upper Extremity Assessment: RUE deficits/detail;LUE deficits/detail RUE Deficits / Details: tremors decreased hand to nose and nose to finger tip RUE Coordination: decreased fine motor LUE Deficits / Details: tremors decreased hand to nose and nose to finger tip LUE Coordination: decreased fine motor   Lower Extremity Assessment Lower Extremity Assessment: Overall WFL for tasks assessed   Cervical / Trunk Assessment Cervical /  Trunk Assessment: Normal   Communication Communication Communication: No difficulties   Cognition Arousal/Alertness: Awake/alert Behavior During Therapy: Flat affect Overall Cognitive Status: Within Functional Limits for tasks assessed                                 General Comments: Flat affect, but otherwise appropriate. OT to further assess higher level cognition   General Comments  when completing the 5 step pathfinding task required increased time to locate markers  in cup on the hand rail. pt continued to look on the actual poster. over all pt was abl to complete all steps with written instruction    Exercises     Shoulder Instructions      Home Living Family/patient expects to be discharged to:: Private residence Living Arrangements: Spouse/significant other;Children Available Help at Discharge: Family Type of Home: House Home Access: Stairs to enter Secretary/administrator of Steps: 3   Home Layout: One level     Bathroom Shower/Tub: Producer, television/film/video: Handicapped height     Home Equipment: Hand held shower head;Grab bars - toilet   Additional Comments: wears dentures and soaks at night. pt report drinking mt dew all day as his form of fluid intake.      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 Problem List:        OT Treatment/Interventions:      OT Goals(Current goals can be found in the care plan section) Acute Rehab OT Goals Patient Stated Goal: to go home  OT Frequency:     Barriers to D/C:            Co-evaluation              AM-PAC OT "6 Clicks" Daily Activity     Outcome Measure Help from another person eating meals?: None Help from another person taking care of personal grooming?: None Help from another person toileting, which includes using toliet, bedpan, or urinal?: None Help from another person bathing (including washing, rinsing, drying)?: None Help from another person to put on and taking off regular upper body clothing?: None Help from another person to put on and taking off regular lower body clothing?: None 6 Click Score: 24   End of Session Nurse Communication: Mobility status;Precautions  Activity Tolerance: Patient tolerated treatment well Patient left: in bed;with call bell/phone within reach;with bed alarm set  OT Visit Diagnosis: Unsteadiness on feet (R26.81)                 Time: 0936-1000 OT Time Calculation (min): 24 min Charges:  OT General Charges $OT Visit: 1 Visit OT Evaluation $OT Eval Moderate Complexity: 1 Mod   Brynn, OTR/L  Acute Rehabilitation Services Pager: [phone] Office: [phone] .   Mateo Flow 10/15/2020, 11:00 AM

## 2020-10-15 NOTE — Progress Notes (Addendum)
  NEUROSURGERY PROGRESS NOTE   No issues overnight.  HA manageable today No N/T/W, changes in vision  EXAM:  BP (!) 116/58   Pulse 84   Temp 98.2 F (36.8 C) (Oral)   Resp 15   Ht 5\' 5"  (1.651 m)   Wt 63.5 kg   SpO2 96%   BMI 23.30 kg/m   Awake, alert, oriented  Speech fluent, appropriate  CN grossly intact  5/5 BUE/BLE  No drift  IMPRESSION/PLAN 55 y.o. male CTA negative SAH D#2. Neurologically intact. - Continue SAH pathway - keppra, nimotop, TCDs, therapy - elevated glucose: A1C. Will add SSI pending results.    I have seen and examined [name] and reviewed the history and physical, and personally reviewed the imaging/lab findings and agree with the impression and plan as documented in the note by Rosalyn Gess, PA-C with the following addition(s):  55 y.o. male presenting with sudden onset severe HA. Neurologically intact. Initial CT demonstrates SAH with CTA negative for aneurysm. Will cont observation, plan on angiogram in delayed fashion.   44, MD [facility]

## 2020-10-15 NOTE — Progress Notes (Signed)
Transcranial Doppler  Date POD PCO2 HCT BP  MCA ACA PCA OPHT SIPH VERT Basilar  3/30 RH     Right  Left   56  53   -21  -18   33  23   20  21    44  29   -43  -43   -47           Right  Left                                            Right  Left                                             Right  Left                                             Right  Left                                            Right  Left                                            Right  Left                                        MCA = Middle Cerebral Artery      OPHT = Opthalmic Artery     BASILAR = Basilar Artery   ACA = Anterior Cerebral Artery     SIPH = Carotid Siphon PCA = Posterior Cerebral Artery   VERT = Verterbral Artery                   Normal MCA = 62+\-12 ACA = 50+\-12 PCA = 42+\-23   RT Lindegaard= LT Lindegaard=  , RDMS, RVT

## 2020-10-15 NOTE — Evaluation (Signed)
Speech Language Pathology Evaluation Patient Details Name: Glen Carter MRN: [id number] DOB: 21-Jul-1965 Today's Date: 10/15/2020 Time:  -     Problem List:  Patient Active Problem List   Diagnosis Date Noted  . SAH (subarachnoid hemorrhage) (HCC) 10/14/2020   Past Medical History:  Past Medical History:  Diagnosis Date  . Migraines    Past Surgical History:  Past Surgical History:  Procedure Laterality Date  . BACK SURGERY     HPI:  Pt is a 55 y.o. M admitted 3/29 with sudden onset of upper neck pain wtih associated headaches. CTA showing grade 1 SAH without aneurysm or vascular malformation. No significant PMH.   Assessment / Plan / Recommendation Clinical Impression  Pt demonstrates normal cognition in a variety of attention, reasoning and problem solving tasks. He struggled with a short term memory task but demosntrates adequate working memory during functional tasks. Discussed that no f/u is recommended at this time but if pt and wife notice further memory impairment after headache and need for pain meds over, then f/u with neuropsychology may be appropriate. Will sign off for now    SLP Assessment  SLP Recommendation/Assessment: Patient does not need any further Speech Lanaguage Pathology Services    Follow Up Recommendations       Frequency and Duration           SLP Evaluation Cognition  Overall Cognitive Status: Within Functional Limits for tasks assessed Arousal/Alertness: Awake/alert Orientation Level: Oriented X4 Attention: Divided;Alternating Alternating Attention: Appears intact Divided Attention: Appears intact Memory: Impaired Memory Impairment: Decreased short term memory Immediate Memory Recall:  (2/5) Awareness: Appears intact Problem Solving: Appears intact Executive Function: Reasoning       Comprehension  Auditory Comprehension Overall Auditory Comprehension: Appears within functional limits for tasks assessed    Expression Verbal  Expression Overall Verbal Expression: Appears within functional limits for tasks assessed Written Expression Dominant Hand: Right   Oral / Motor  Oral Motor/Sensory Function Overall Oral Motor/Sensory Function: Within functional limits Motor Speech Overall Motor Speech: Appears within functional limits for tasks assessed   GO                   Glen Ditty, MA CCC-SLP  Acute Rehabilitation Services Pager [phone] Office [phone]  Glen Carter 10/15/2020, 12:07 PM

## 2020-10-15 NOTE — Evaluation (Signed)
Physical Therapy Evaluation and Discharge Patient Details Name: Glen Carter MRN: [id number] DOB: 1965-11-08 Today's Date: 10/15/2020   History of Present Illness  Pt is a 55 y.o. M admitted 3/29 with sudden onset of upper neck pain wtih associated headaches. CTA showing grade 1 SAH without aneurysm or vascular malformation. No significant PMH.  Clinical Impression  Patient evaluated by Physical Therapy with no further acute PT needs identified. Pt reporting 3/10 headache and mild photophobia. Pt demonstrates flat affect and denies changes in vision. Pt ambulating x 500 feet with no assistive device and negotiated 3 steps independently. Pt scoring 24/24 on the Dynamic Gait Index, indicating he is not at risk for falls. All education has been completed and the patient has no further questions. No follow-up Physical Therapy or equipment needs. PT is signing off. Thank you for this referral.      Follow Up Recommendations No PT follow up    Equipment Recommendations  None recommended by PT    Recommendations for Other Services       Precautions / Restrictions Precautions Precautions: None Restrictions Weight Bearing Restrictions: No      Mobility  Bed Mobility Overal bed mobility: Independent                  Transfers Overall transfer level: Independent Equipment used: None                Ambulation/Gait Ambulation/Gait assistance: Independent Gait Distance (Feet): 500 Feet Assistive device: None Gait Pattern/deviations: WFL(Within Functional Limits)        Stairs Stairs: Yes Stairs assistance: Independent Stair Management: No rails Number of Stairs: 3 General stair comments: step over step pattern  Wheelchair Mobility    Modified Rankin (Stroke Patients Only) Modified Rankin (Stroke Patients Only) Pre-Morbid Rankin Score: No symptoms Modified Rankin: No symptoms     Balance Overall balance assessment: No apparent balance deficits (not  formally assessed)                               Standardized Balance Assessment Standardized Balance Assessment : Dynamic Gait Index   Dynamic Gait Index Level Surface: Normal Change in Gait Speed: Normal Gait with Horizontal Head Turns: Normal Gait with Vertical Head Turns: Normal Gait and Pivot Turn: Normal Step Over Obstacle: Normal Step Around Obstacles: Normal Steps: Normal Total Score: 24       Pertinent Vitals/Pain Pain Assessment: 0-10 Pain Score: 3  Pain Location: headache Pain Descriptors / Indicators: Headache Pain Intervention(s): Monitored during session    Home Living Family/patient expects to be discharged to:: Private residence Living Arrangements: Spouse/significant other;Children (58 y.o. 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 OT to further assess higher level cognition      General Comments      Exercises     Assessment/Plan    PT Assessment Patent does not need any further PT services  PT Problem List         PT Treatment Interventions      PT Goals (Current goals can be found in  the Care Plan section)  Acute Rehab PT Goals Patient Stated Goal: to go home PT Goal Formulation: All assessment and education complete, DC therapy    Frequency     Barriers to discharge        Co-evaluation               AM-PAC PT "6 Clicks" Mobility  Outcome Measure Help needed turning from your back to your side while in a flat bed without using bedrails?: None Help needed moving from lying on your back to sitting on the side of a flat bed without using bedrails?: None Help needed moving to and from a bed to a chair (including a wheelchair)?: None Help needed standing up from a chair using your arms (e.g., wheelchair or bedside chair)?: None Help needed to walk in hospital room?: None Help needed climbing 3-5 steps with a railing? : None 6 Click Score: 24    End of Session   Activity Tolerance: Patient tolerated treatment well Patient left: in chair;with call bell/phone within reach;with family/visitor present Nurse Communication: Mobility status PT Visit Diagnosis: Other symptoms and signs involving the nervous system (R29.898)    Time: 2536-6440 PT Time Calculation (min) (ACUTE ONLY): 21 min   Charges:   PT Evaluation $PT Eval Low Complexity: 1 Low          Lillia Pauls, PT, DPT Acute Rehabilitation Services Pager [phone] Office [phone]   Norval Morton 10/15/2020, 9:12 AM

## 2020-10-16 NOTE — Progress Notes (Signed)
  NEUROSURGERY PROGRESS NOTE   No issues overnight. Pt reports significantly improved HA today.  EXAM:  BP 123/71   Pulse 72   Temp 97.8 F (36.6 C) (Oral)   Resp (!) 26   Ht 5\' 5"  (1.651 m)   Wt 63.5 kg   SpO2 97%   BMI 23.30 kg/m   Awake, alert, oriented  Speech fluent, appropriate  CN grossly intact  5/5 BUE/BLE   IMPRESSION:  55 y.o. male SAH d# 3, initial CTA negative. Neurologically remains well.  PLAN: - Cont observation - Cont Nimotop - Plan on angiogram next week   44, MD [facility]

## 2020-10-17 ENCOUNTER — Inpatient Hospital Stay (HOSPITAL_COMMUNITY): Payer: Commercial Managed Care - PPO

## 2020-10-17 ENCOUNTER — Other Ambulatory Visit (HOSPITAL_COMMUNITY): Payer: Self-pay | Admitting: Radiology

## 2020-10-17 DIAGNOSIS — I609 Nontraumatic subarachnoid hemorrhage, unspecified: Secondary | ICD-10-CM

## 2020-10-17 NOTE — Progress Notes (Signed)
Transcranial Doppler  Date POD PCO2 HCT BP  MCA ACA PCA OPHT SIPH VERT Basilar  3/30 RH     Right  Left   56  53   -21  -18   33  23   20  21    44  29   -43  -43   -47      4/1 MR     Right  Left   64  68   -25  -49   36  37   16  19   27   33   -37  -39   -55           Right  Left                                             Right  Left                                             Right  Left                                            Right  Left                                            Right  Left                                        MCA = Middle Cerebral Artery      OPHT = Opthalmic Artery     BASILAR = Basilar Artery   ACA = Anterior Cerebral Artery     SIPH = Carotid Siphon PCA = Posterior Cerebral Artery   VERT = Verterbral Artery                   Normal MCA = 62+\-12 ACA = 50+\-12 PCA = 42+\-23   RT Lindegaard= LT Lindegaard=  Glen Carter 10/17/2020 9:22 AM

## 2020-10-17 NOTE — Progress Notes (Signed)
  NEUROSURGERY PROGRESS NOTE   No issues overnight. No HA today  EXAM:  BP 100/60   Pulse (!) 53   Temp 98.4 F (36.9 C) (Oral)   Resp 14   Ht 5\' 5"  (1.651 m)   Wt 63.5 kg   SpO2 97%   BMI 23.30 kg/m   Awake, alert, oriented  Speech fluent, appropriate  CN grossly intact  5/5 BUE/BLE   IMPRESSION:  55 y.o. male SAH d#4, CTA negative. Remains neurologically intact  PLAN: - Cont supportive care - Plan on repeat angiogram Monday afternoon   Friday, MD [facility]

## 2020-10-17 NOTE — Progress Notes (Signed)
Spoke with NP Vinny Costella--verbal order received to reduce maintenance fluids from 149ml/hr to 37ml/hr.

## 2020-10-18 NOTE — Progress Notes (Signed)
Subjective: Patient reports no real headache or numbness or tingling or visual changes  Objective: Vital signs in last 24 hours: Temp:  [97.6 F (36.4 C)-98.3 F (36.8 C)] 98.3 F (36.8 C) (04/02 0400) Pulse Rate:  [58-75] 71 (04/02 0800) Resp:  [10-18] 11 (04/02 0800) BP: (101-148)/(62-111) 132/82 (04/02 0800) SpO2:  [94 %-99 %] 95 % (04/02 0800)  Intake/Output from previous day: 04/01 0701 - 04/02 0700 In: 1496.5 [I.V.:1496.5] Out: -  Intake/Output this shift: Total I/O In: 50 [I.V.:50] Out: -   Neurologic: Grossly normal  Lab Results: Lab Results  Component Value Date   WBC 12.7 (H) 10/14/2020   HGB 15.7 10/14/2020   HCT 46.5 10/14/2020   MCV 85.6 10/14/2020   PLT 321 10/14/2020   Lab Results  Component Value Date   INR 1.0 10/14/2020   BMET Lab Results  Component Value Date   NA 139 10/14/2020   K 4.0 10/14/2020   CL 103 10/14/2020   CO2 27 10/14/2020   GLUCOSE 162 (H) 10/14/2020   BUN 19 10/14/2020   CREATININE 1.06 10/14/2020   CALCIUM 9.0 10/14/2020    Studies/Results: VAS [place] TRANSCRANIAL DOPPLER  Result Date: 10/17/2020  Transcranial Doppler Indications: Subarachnoid hemorrhage. Comparison Study: previous 3/30 Performing Technologist: Blanch Media RVS  Examination Guidelines: A complete evaluation includes B-mode imaging, spectral Doppler, color Doppler, and power Doppler as needed of all accessible portions of each vessel. Bilateral testing is considered an integral part of a complete examination. Limited examinations for reoccurring indications may be performed as noted.  +----------+-------------+----------+-----------+-------+ RIGHT TCD Right VM (cm)Depth (cm)PulsatilityComment +----------+-------------+----------+-----------+-------+ MCA           64.00                 1.03            +----------+-------------+----------+-----------+-------+ ACA          -25.00                 0.62             +----------+-------------+----------+-----------+-------+ Term ICA      56.00                 1.13            +----------+-------------+----------+-----------+-------+ PCA           36.00                 0.93            +----------+-------------+----------+-----------+-------+ Opthalmic     16.00                 1.69            +----------+-------------+----------+-----------+-------+ ICA siphon    27.00                 1.29            +----------+-------------+----------+-----------+-------+ Vertebral    -37.00                 1.06            +----------+-------------+----------+-----------+-------+  +----------+------------+----------+-----------+-------+ LEFT TCD  Left VM (cm)Depth (cm)PulsatilityComment +----------+------------+----------+-----------+-------+ MCA          68.00                 1.24            +----------+------------+----------+-----------+-------+ ACA          -49.00  0.84            +----------+------------+----------+-----------+-------+ Term ICA     37.00                 0.99            +----------+------------+----------+-----------+-------+ PCA          37.00                 0.92            +----------+------------+----------+-----------+-------+ Opthalmic    19.00                 1.54            +----------+------------+----------+-----------+-------+ ICA siphon   33.00                 1.00            +----------+------------+----------+-----------+-------+ Vertebral    -39.00                1.01            +----------+------------+----------+-----------+-------+  +------------+-------+-------+             VM cm/sComment +------------+-------+-------+ Prox Basilar-55.00         +------------+-------+-------+ Summary: This was a normal transcranial Doppler study, with normal flow direction and velocity of all identified vessels of the anterior and posterior circulations, with no evidence of  stenosis, vasospasm or occlusion. There was no evidence of intracranial disease. *See table(s) above for TCD measurements and observations.  Diagnosing physician: Delia Heady MD Electronically signed by Delia Heady MD on 10/17/2020 at 11:32:04 AM.    Final     Assessment/Plan: Seems to be doing well.  Angiogram Monday  Estimated body mass index is 23.3 kg/m as calculated from the following:   Height as of this encounter: 5\' 5"  (1.651 m).   Weight as of this encounter: 63.5 kg.    LOS: 4 days    10/18/2020, 9:04 AM

## 2020-10-19 MED ORDER — POLYETHYLENE GLYCOL 3350 17 G PO PACK
17.0000 g | PACK | Freq: Every day | ORAL | Status: DC | PRN
  Administered 2020-10-19: 17 g via ORAL
  Filled 2020-10-19: qty 1

## 2020-10-19 NOTE — Plan of Care (Signed)
  Problem: Elimination: Goal: Will not experience complications related to bowel motility Outcome: Progressing   Patient states he hasn't moved his bowels X 4 days. +Flatus, Abd soft, non tender, Active bowel sounds. Colace given BID. NP Bergman aware, orders obtained. Patient educated with good understanding verbalized.

## 2020-10-19 NOTE — Progress Notes (Signed)
Patient is stable.  Some headaches.  He is awake and alert pleasant.  No focal neurologic deficit.  Plan is for angiogram tomorrow.

## 2020-10-20 ENCOUNTER — Inpatient Hospital Stay (HOSPITAL_COMMUNITY): Payer: Commercial Managed Care - PPO

## 2020-10-20 ENCOUNTER — Other Ambulatory Visit (HOSPITAL_COMMUNITY): Payer: Commercial Managed Care - PPO

## 2020-10-20 ENCOUNTER — Other Ambulatory Visit: Payer: Self-pay | Admitting: Interventional Radiology

## 2020-10-20 HISTORY — PX: IR ANGIO INTRA EXTRACRAN SEL INTERNAL CAROTID BILAT MOD SED: IMG5363

## 2020-10-20 HISTORY — PX: IR ANGIO VERTEBRAL SEL SUBCLAVIAN INNOMINATE BILAT MOD SED: IMG5366

## 2020-10-20 IMAGING — XA IR ANGIO VERTEBRAL SEL SUBCLAVIAN INNOMINATE BILAT MOD SED
9 of 10 series · 12 of 24 positions shown · IV contrast (IODINE)
Comparison: none

PROCEDURE:
DIAGNOSTIC CEREBRAL ANGIOGRAM
HISTORY: The patient is a 54-year-old man initially presented to the hospital
approximately 1 week ago with sudden onset of severe headache.
Initial CT scan demonstrated primarily posterior fossa subarachnoid
hemorrhage. Initial CT angiogram was of excellent quality and
negative for intracranial aneurysm. Patient has been monitored in
the intensive care unit and presents today for follow-up diagnostic
cerebral angiogram.
TECHNIQUE: CATHETERS AND WIRES
5-French JB-1 catheter

[Series 1: cerebral care 2 · 2 acquisitions, 1 frame shown (1 of 8)]
[im 1/2]
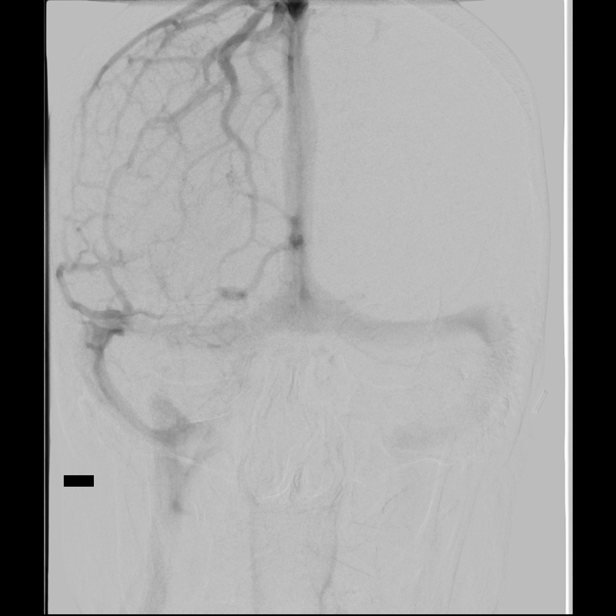

[Series 2: cerebral care 2 · 2 acquisitions, 2 frames shown (2 of 8)]
[im 1/2]
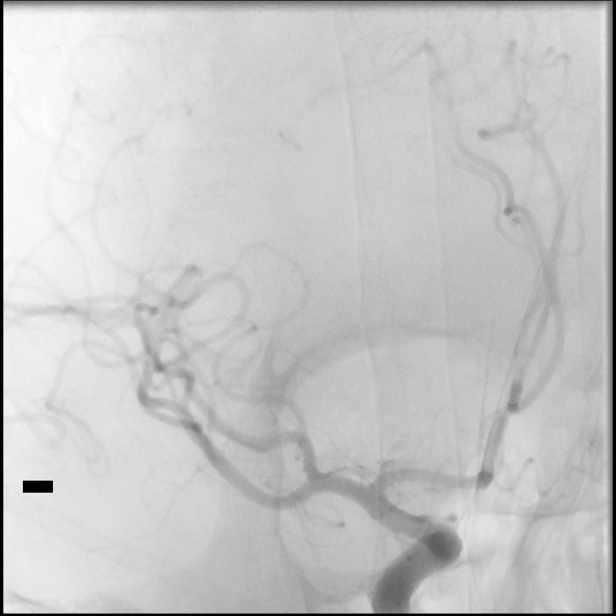
[im 2/2]
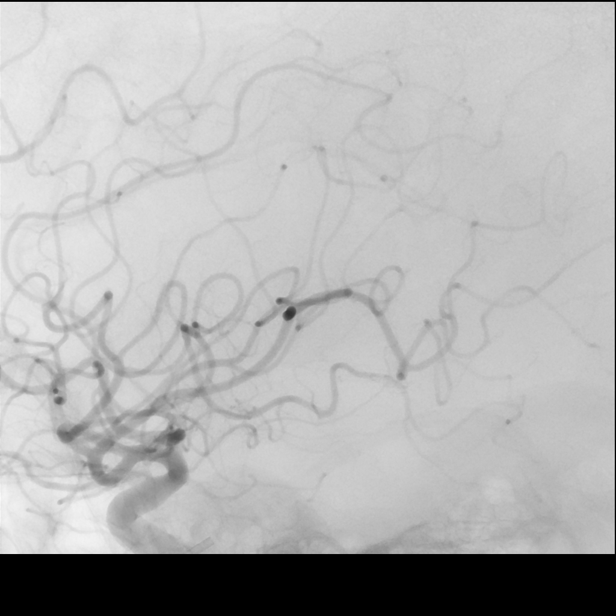

[Series 3: cerebral care 2 · 2 acquisitions, 1 frame shown (3 of 8)]
[im 1/2]
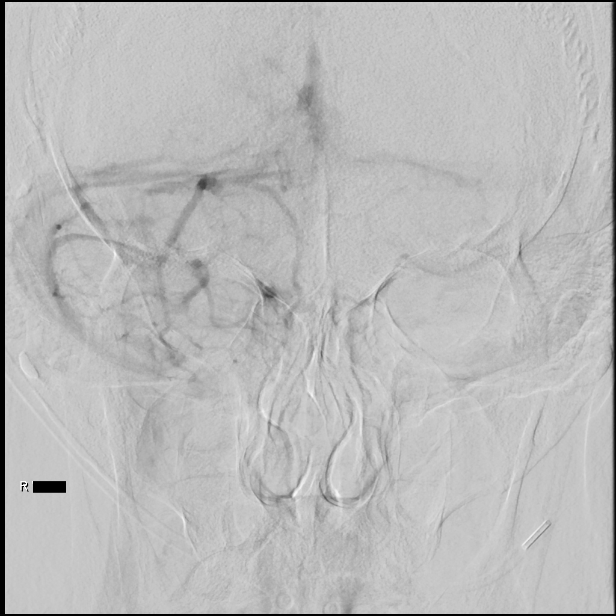

[Series 4: cerebral care 2 · 2 acquisitions, 1 frame shown (4 of 8)]
[im 1/2]
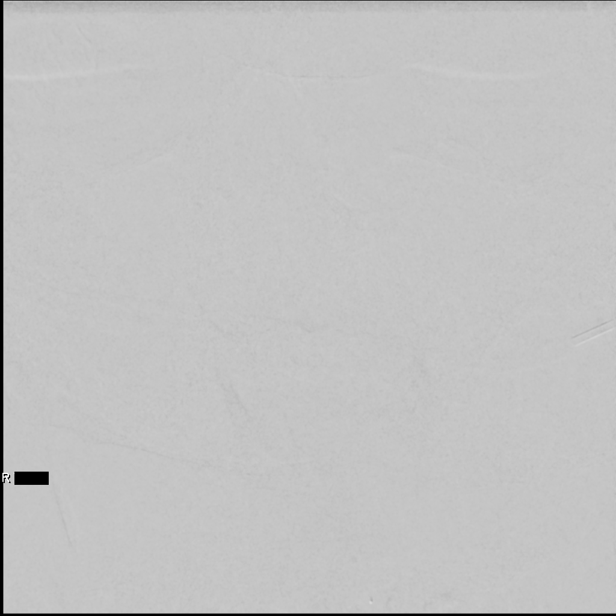

[Series 5: cerebral care 2 · 2 acquisitions, 1 frame shown (5 of 8)]
[im 1/2]
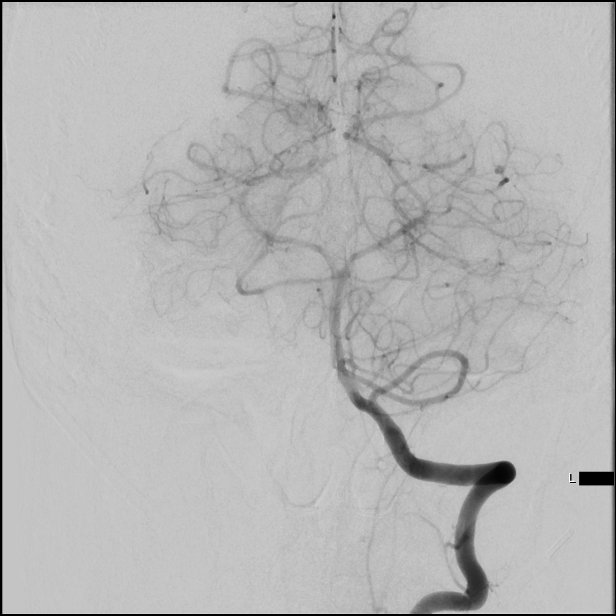

[Series 6: cerebral care 2 · 2 acquisitions, 1 frame shown (6 of 8)]
[im 1/2]
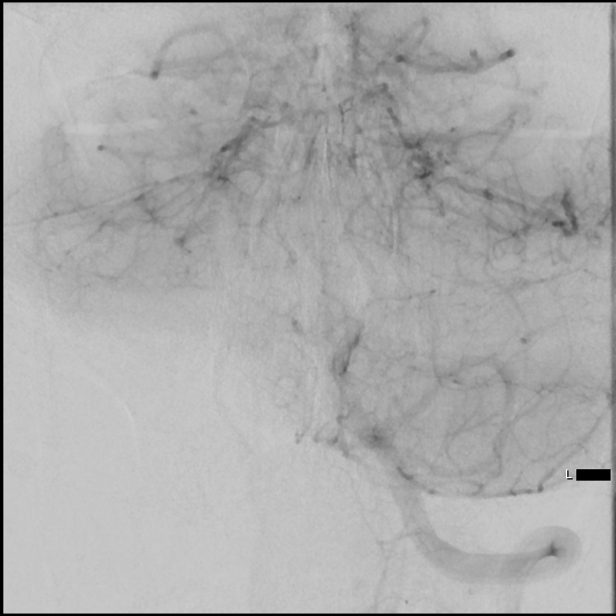

[Series 7: cerebral care 2 · 2 acquisitions, 1 frame shown (7 of 8)]
[im 1/2]
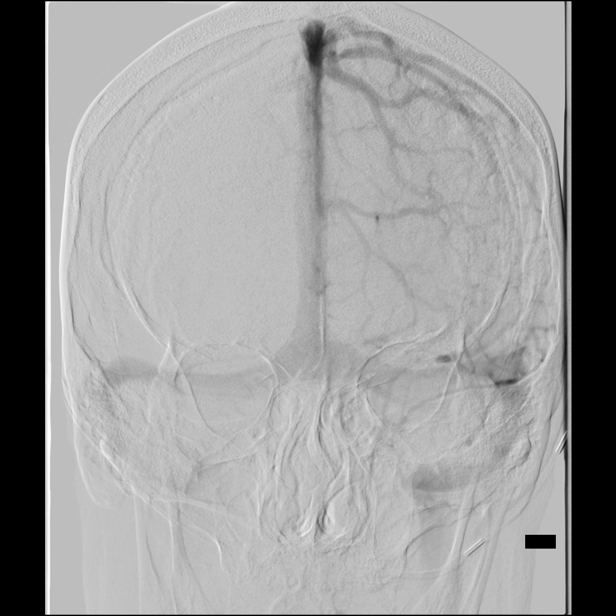

[Series 8: cerebral care 2 · 2 acquisitions, 1 frame shown (8 of 8)]
[im 1/2]
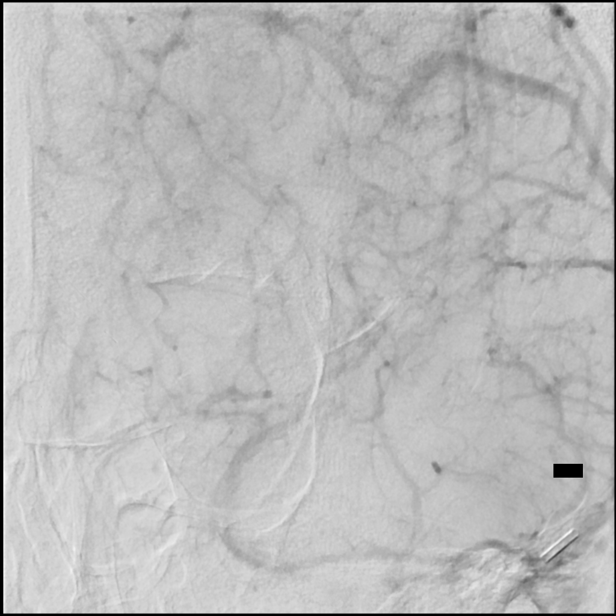

[Series 300: ir angio vertebral sel subclavian innomi · 3 of 17 slices shown]
[im 2/17]
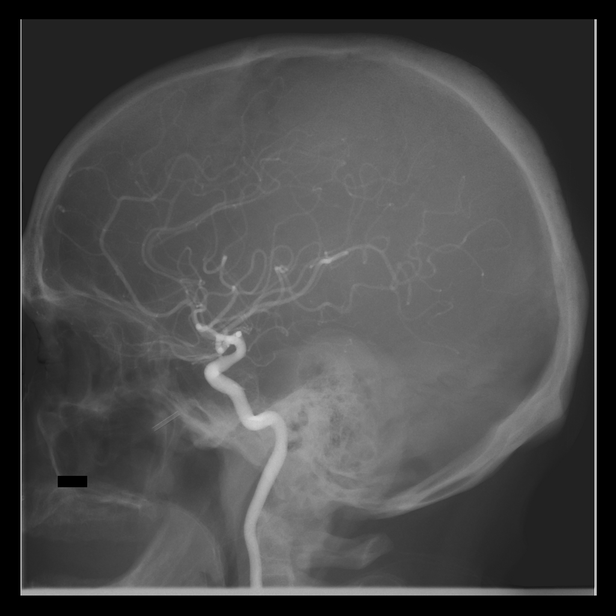
[im 9/17]
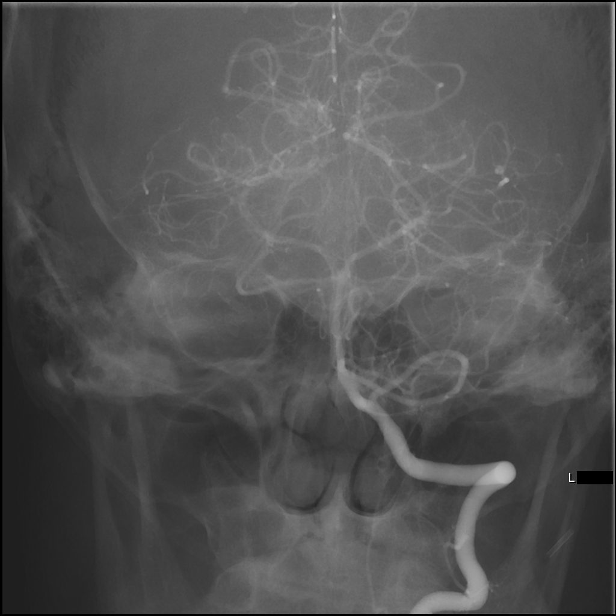
[im 17/17]
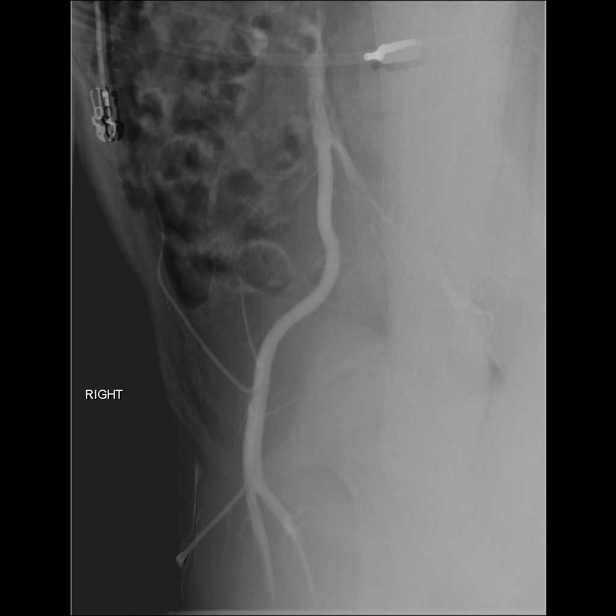

[12 of 24 positions shown; findings below may reference images not displayed]

ACCESS:
The technical aspects of the procedure as well as its potential
risks and benefits were reviewed with the patient. These risks
included but were not limited bleeding, infection, allergic
reaction, damage to organs or vital structures, stroke,
non-diagnostic procedure, and the catastrophic outcomes of heart
attack, coma, and death. With an understanding of these risks,
informed consent was obtained and witnessed. The patient was placed
in the supine position on the angiography table and the skin of
right groin prepped in the usual sterile fashion.

The procedure was performed under local anesthesia (1%-solution of
bicarbonate-buffered Lidocaine) and conscious sedation with
[1N] fentanyl monitored by myself and the in-suite nurse
using continuous pulse-oximetry, heart rate, and non-invasive
blood-pressure.

A 5- French sheath was introduced in the right common femoral artery
using Seldinger technique. A fluoro-phase sequence was used to
document the sheath position.

MEDICATIONS:
HEPARIN: 0 Units total.

CONTRAST:  cc, Omnipaque 300

FLUOROSCOPY TIME:  FLUOROSCOPY TIME: See IR records
0.035" glidewire

VESSELS CATHETERIZED
Right internal carotid

Left internal carotid

Left vertebral

Right vertebral

Right common femoral

VESSELS STUDIED
Right internal carotid, head

Left internal carotid, head

Left vertebral

Right vertebral

Right femoral

PROCEDURAL NARRATIVE
A 5-Fr JB-1 catheter was advanced over a 0.035 glidewire into the
aortic arch. The above vessels were then sequentially catheterized
and cervical / cerebral angiograms taken. After review of images,
the catheter was removed without incident.
FINDINGS: Right internal carotid, head:

Injection reveals the presence of a widely patent ICA, M1, and A1
segments and their branches. No aneurysms, AVMs, or high-flow
fistulas are seen. There is no vasospasm of the right carotid
circulation. The parenchymal and venous phases are normal. The
venous sinuses are widely patent.

Left internal carotid, head:

Injection reveals the presence of a widely patent ICA, A1, and M1
segments and their branches. No aneurysms, AVMs, or high-flow
fistulas are seen. There is no vasospasm of the left carotid
circulation. The parenchymal and venous phases are normal. The
venous sinuses are widely patent.

Left vertebral:

Injection reveals the presence of a widely patent vertebral artery.
This leads to a widely patent basilar artery that terminates in
bilateral P1. The basilar apex is normal. No aneurysms, AVMs, or
high-flow fistulas are seen. There is no posterior circulation
vasospasm. The parenchymal and venous phases are normal. The venous
sinuses are widely patent.

Right vertebral:

The vertebral artery is widely patent. No PICA aneurysm is seen. See
basilar description above.

Right femoral:

Normal vessel. No significant atherosclerotic disease. Arterial
sheath in adequate position.

DISPOSITION:
Upon completion of the study, the femoral sheath was removed and
hemostasis obtained using a 5-Fr ExoSeal closure device. Good
proximal and distal lower extremity pulses were documented upon
achievement of hemostasis. The procedure was well tolerated and no
early complications were observed. The patient was transferred to
the holding area to lay flat for 2 hours.
IMPRESSION: 1. Normal cerebral angiogram, without evidence for intracranial
aneurysm, arteriovenous malformation, or high flow fistula. There is
no vasospasm.

The preliminary results of this procedure were shared with the
patient.

## 2020-10-20 MED ORDER — LIDOCAINE HCL 1 % IJ SOLN
INTRAMUSCULAR | Status: AC
  Filled 2020-10-20: qty 20

## 2020-10-20 MED ORDER — OXYCODONE HCL 5 MG PO TABS: 5.0000 mg | ORAL_TABLET | ORAL | 0 refills | Status: DC | PRN

## 2020-10-20 MED ORDER — FENTANYL CITRATE (PF) 100 MCG/2ML IJ SOLN
INTRAMUSCULAR | Status: AC
  Filled 2020-10-20: qty 2

## 2020-10-20 MED ORDER — HEPARIN SODIUM (PORCINE) 1000 UNIT/ML IJ SOLN
INTRAMUSCULAR | Status: AC
  Filled 2020-10-20: qty 1

## 2020-10-20 MED ORDER — IOHEXOL 300 MG/ML  SOLN: 150.0000 mL | Freq: Once | INTRAMUSCULAR | Status: DC | PRN

## 2020-10-20 MED ORDER — LIDOCAINE HCL 1 % IJ SOLN
INTRAMUSCULAR | Status: AC | PRN
  Administered 2020-10-20: 10 mL

## 2020-10-20 MED ORDER — FENTANYL CITRATE (PF) 100 MCG/2ML IJ SOLN
INTRAMUSCULAR | Status: AC | PRN
  Administered 2020-10-20: 50 ug via INTRAVENOUS

## 2020-10-20 NOTE — Discharge Summary (Addendum)
  Physician Discharge Summary  Patient ID: Glen Carter MRN: [id number] DOB/AGE: 53-Aug-1967 53 y.o.  Admit date: 10/14/2020 Discharge date: 10/20/2020  Admission Diagnoses:  [facility]  Discharge Diagnoses:  Same Active Problems:   SAH (subarachnoid hemorrhage) [facility])   Discharged Condition: Stable  Hospital Course:  Glen Carter is a 55 y.o. male Who presented to the emergency department on 10/14/2020 after acute onset severe headache. He underwent workup and was found to have a a subarachnoid hemorrhage without identifiable cause on CTA (ie  Aneurysm, vascular malformation, etc). He was neurologically intact and admitted to the ICU for further workup and management.  He was treated as an aneurysmal subarachnoid hemorrhage in started on nimodipine for vasospasm prevention.  He underwent serial TCDs which did not reveal evidence of vasospasm.  On 10/20/2020 he underwent diagnostic cerebral angiogram for definitive vascular rule out.  Angiogram was negative.  Patient was therefore discharged home in hemodynamically stable condition.  He does not need to continue on with the nimodipine.  We will plan to follow up in several weeks for a clinical recheck.  Treatments: Surgery  4/4: diagnostic cerebral angiogram  Discharge Exam: Blood pressure 132/83, pulse 68, temperature 97.9 F (36.6 C), temperature source Oral, resp. rate 14, height 5\' 5"  (1.651 m), weight 63.5 kg, SpO2 96 %. Awake, alert, oriented Speech fluent, appropriate CN grossly intact 5/5 BUE/BLE Wound c/d/i  Disposition: home  Discharge Instructions    Call MD for:  difficulty breathing, headache or visual disturbances   Complete by: As directed    Call MD for:  persistant dizziness or light-headedness   Complete by: As directed    Call MD for:  redness, tenderness, or signs of infection (pain, swelling, redness, odor or green/yellow discharge around incision site)   Complete by: As directed    Call MD for:   severe uncontrolled pain   Complete by: As directed    Call MD for:  temperature >100.4   Complete by: As directed    Driving Restrictions   Complete by: As directed    Do not drive until given clearance.   Increase activity slowly   Complete by: As directed      Allergies as of 10/20/2020   No Known Allergies     Medication List    TAKE these medications   naproxen sodium 220 MG tablet Commonly known as: ALEVE Take 220-440 mg by mouth as needed (pain).   oxyCODONE 5 MG immediate release tablet Commonly known as: Oxy IR/ROXICODONE Take 1 tablet (5 mg total) by mouth every 4 (four) hours as needed for moderate pain.       Follow-up Information    12/20/2020, MD. Schedule an appointment as soon as possible for a visit in 3 week(s).   Specialty: Neurosurgery Contact information: [address] [place] [phone]               Signed: [phone] 10/20/2020, 11:45 AM

## 2020-10-20 NOTE — Progress Notes (Signed)
Patient ambulated.  Tolerated well.

## 2020-10-20 NOTE — Progress Notes (Signed)
VASCULAR LAB    Attempted TCD, however, patient in IR at 14:20   Timira Bieda, RVT 10/20/2020, 2:20 PM

## 2020-10-20 NOTE — Progress Notes (Signed)
  NEUROSURGERY PROGRESS NOTE   Pt seen and examined. No issues overnight. Mild HA this am.  EXAM: Temp:  [97.9 F (36.6 C)-98.7 F (37.1 C)] 98.7 F (37.1 C) (04/04 1200) Pulse Rate:  [63-80] 75 (04/04 1300) Resp:  [8-19] 10 (04/04 1300) BP: (107-148)/(50-92) 133/92 (04/04 1300) SpO2:  [93 %-98 %] 98 % (04/04 1300) Intake/Output      04/03 0701 04/04 0700 04/04 0701 04/05 0700   P.O. 240    I.V. (mL/kg) 1078.9 (17) 11.1 (0.2)   Total Intake(mL/kg) 1318.9 (20.8) 11.1 (0.2)   Net +1318.9 +11.1        Urine Occurrence 3 x 4 x   Stool Occurrence  1 x    Awake, alert, oriented CN grossly intact Good strength throughout  LABS: Lab Results  Component Value Date   CREATININE 1.06 10/14/2020   BUN 19 10/14/2020   NA 139 10/14/2020   K 4.0 10/14/2020   CL 103 10/14/2020   CO2 27 10/14/2020   Lab Results  Component Value Date   WBC 12.7 (H) 10/14/2020   HGB 15.7 10/14/2020   HCT 46.5 10/14/2020   MCV 85.6 10/14/2020   PLT 321 10/14/2020    IMPRESSION: - 55 y.o. male SAH d# 7, initial angio negative. Neurologically intact  PLAN: - Repeat angiogram today - If negative, likely d/c home   Lisbeth Renshaw, MD [facility]

## 2020-10-20 NOTE — Brief Op Note (Signed)
  NEUROSURGERY BRIEF OPERATIVE  NOTE   PREOP DX: Subarachnoid Hemorrhage  POSTOP DX: Same  PROCEDURE: Diagnostic cerebral angiogram  SURGEON: Dr. Lisbeth Renshaw, MD  ANESTHESIA: IV Sedation with Local  EBL: Minimal  SPECIMENS: None  COMPLICATIONS: None  CONDITION: Stable to recovery  FINDINGS (Full report in [facility]): 1. Normal cerebral angiogram. No aneurysms, AVM, or fistula. No vasopsasm.   Lisbeth Renshaw, MD [facility]

## 2020-11-04 ENCOUNTER — Encounter: Payer: Self-pay | Admitting: *Deleted

## 2020-11-04 ENCOUNTER — Other Ambulatory Visit: Payer: Self-pay | Admitting: *Deleted

## 2020-11-04 NOTE — Patient Outreach (Signed)
[facility]) Care Management  11/04/2020  Glen Carter [birth year] [id number]   [facility] outreach for EMMI-stroke D/c from [facility]  RED ON EMMI ALERT-patient Day #  9       Date: Friday October 31 2020 1301 Red Alert Reason: feeling worse overall? Yes New problems walking/talking/speaking/seeing? yes   Insurance:  [place] [facility])  [facility] admissions x  1  ED visits x 1 in the last 6 months  Lat admission 10/14/20- 10/20/20 subdural hematoma  Outreach attempt # 1 successful at [phone] spoke with wife [name] as the EMMI outreach number is [name]'s and pt is not with her at the time of the outreach [facility] Care Management RN reviewed and addressed red alert with patient  Consent: [facility] RN CM reviewed [facility] services with patient. Patient gave verbal consent for services [facility] telephonic RN CM.   Advised patient that there will be further automated EMMI- post discharge calls to assess how the patient is doing following the recent hospitalization Advised the patient that another call may be received from a nurse if any of their responses were abnormal. Patient voiced understanding and was appreciative of f/u call.    EMMI:  Reports pt is doing better since the 10/31/20 EMMI outreach and has an appointment on 11/05/20 with the neurosurgeon nundkumar  Wife confirms pt does not have a pcp at this time Other needs denied at this time  Past Medical History:  Diagnosis Date  . Migraines    Patient Active Problem List   Diagnosis Date Noted  . SAH (subarachnoid hemorrhage) (HCC) 10/14/2020      Plan: Patient agrees to the care plan and follow up  Within the next 7-10 business days   Hayde Kilgour L. Noelle Penner, RN, BSN, CCM [facility] Telephonic Care Management Care Coordinator Office number [phone] Mobile number [phone]  Main [facility] number [phone] Fax number [phone]

## 2020-11-05 DIAGNOSIS — R030 Elevated blood-pressure reading, without diagnosis of hypertension: Secondary | ICD-10-CM | POA: Insufficient documentation

## 2020-11-12 DIAGNOSIS — M545 Low back pain, unspecified: Secondary | ICD-10-CM | POA: Insufficient documentation

## 2020-11-14 ENCOUNTER — Other Ambulatory Visit: Payer: Self-pay | Admitting: *Deleted

## 2020-11-14 ENCOUNTER — Other Ambulatory Visit: Payer: Self-pay

## 2020-11-14 ENCOUNTER — Encounter: Payer: Self-pay | Admitting: *Deleted

## 2020-11-14 NOTE — Patient Outreach (Addendum)
[facility]) Care Management  11/14/2020  Glen Carter [birth year] [id number]  [facility] outreach follow up to [facility] stroke referred patient  Glen Carter was referred on 11/03/20 D/c from [facility], not on APL RED ON EMMI ALERT-patient Day #  9       Date: Friday October 31 2020 1301 Red Alert Reason: feeling worse overall? Yes New problems walking/talking/speaking/seeing? Yes    Insurance:  [employer] [facility])  [facility] admissions x  1  ED visits x 1 in the last 6 months  Lat admission 10/14/20- 10/20/20 subdural hematoma  Outreach  # 2 successful  Preference is to outreach to patient mobile number [phone] as verified by pt    Patient is able to verify HIPAA [facility] Portability and Accountability Act) identifiers Reviewed and addressed the purpose of the follow up call with the patient  Consent: [facility] ([facility]) RN CM reviewed [facility] services with patient. Patient gave verbal consent for services.   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 At his previous neurosurgery visit approximately 1 week ago Medrol Dosepak was provided to help improve his pain but it is still present.  Back pain is reported to occur when he gets up and ambulating.  Takes oxycodone with Tylenol with some relief No bowel or bladder dysfunction. No wt loss Reports a weight of approximately 140 lbs and Height of 5'5  No changes in appetite  primary care provider (PCP) Pt reports he continues to not have a pcp and presently is not  looking for one  [facility] RN CM reviewed with him the importance of pcp services and that the specialist generally will refer him back to a pcp when released from specialist services He voiced understanding   He reports he had a flu shot in 2021 at his job  Various background noises heard with laps in the outreach at intervals  [facility] RN CM discussed follow up with Glen Carter at a later time   no further identified needs  Sent pt a [facility] successful outreach letter to included the discussed [facility] 24 hour nurse call center number to use if issues arrive  He and wife voiced understanding No MD letter sent to a pcp as pt has not chosen a pcp at this time   Barriers: Knowledge   Past Medical History:  Diagnosis Date  . Migraines    Patient Active Problem List   Diagnosis Date Noted  . Acute low back pain 11/12/2020  . Elevated blood-pressure reading, without diagnosis of hypertension 11/05/2020  . Subarachnoid hemorrhage (HCC) 10/14/2020      Plans [facility] RN CM will follow up with Glen Carter as agreed in 14-21 business days and prepare for case closure if no further identified needs    Glen Carter L. Glen Penner, RN, BSN, CCM [facility] Telephonic Care Management Care Coordinator Office number [phone] Main [facility] number [phone] Fax number [phone]

## 2020-11-14 NOTE — Patient Outreach (Incomplete Revision)
[facility]) Care Management  11/14/2020  Glen Carter [birth year] [id number]  [facility] outreach follow up to [facility] stroke referred patient  Glen Carter was referred on 11/03/20 D/c from [facility], not on APL RED ON EMMI ALERT-patient Day #  9       Date: Friday October 31 2020 1301 Red Alert Reason: feeling worse overall? Yes New problems walking/talking/speaking/seeing? Yes    Insurance:  [employer] [name])  [facility] admissions x  1  ED visits x 1 in the last 6 months  Lat admission 10/14/20- 10/20/20 subdural hematoma  Outreach  # 2 successful  Preference is to outreach to patient mobile number [phone] as verified by pt    Patient is able to verify HIPAA Glen Carter Portability and Accountability Act) identifiers Reviewed and addressed the purpose of the follow up call with the patient  Consent: [facility] ([facility]) RN CM reviewed [facility] services with patient. Patient gave verbal consent for services.   Follow up assessment  Confirms his follow up appointment with Dr Conchita Paris was attended  He denies concerns or questions after the appointment He voices being aware that his is to remain out of work Still with lower back issues but reports he is consulting with his MD, Conchita Paris on treatment plan  Pain level today is reported as aching at a level of 2-3   low back and tailbone pain which is thought to be secondary to dependent settling of blood products within the lumbar cistern. At his previous neurosurgery visit approximately 1 week ago Medrol Dosepak was provided to help improve his pain but it is still present.  Back pain when up and ambulating.  Takes oxycodone with Tylenol with some relief No bowel or bladder dysfunction. No wt loss Reports a weight of approximately 140 lbs and Height of 5'5  No changes in appetite  primary care provider (PCP) Pt reports he continues to not have a pcp and presently is not looking for one  [facility] RN CM  reviewed with him the importance of pcp services and that the specialist generally will refer him back to a pcp when released from specialist services He voiced understanding   He reports he had a flu hot in 2021 at his job  Various background noises heard with laps in the outreach at intervals  [facility] RN CM discussed follow up with Glen Carter at a later time   no further identified needs  Sent pt a [facility] successful outreach letter to included the discussed [facility] 24 hour nurse call center number to use if issues arrive  He and wife voiced understanding No MD letter sent to a pcp as pt has not chosen a pcp at this time    Past Medical History:  Diagnosis Date  . Migraines    Patient Active Problem List   Diagnosis Date Noted  . Acute low back pain 11/12/2020  . Elevated blood-pressure reading, without diagnosis of hypertension 11/05/2020  . Subarachnoid hemorrhage (HCC) 10/14/2020      Plans [facility] RN CM will follow up with Glen Carter as agreed in 14-21 business days and prepare for case closure if no further identified needs    Glen Barsky L. Noelle Penner, RN, BSN, CCM [facility] Telephonic Care Management Care Coordinator Office number [phone] Main [facility] number [phone] Fax number [phone]

## 2020-11-28 ENCOUNTER — Other Ambulatory Visit: Payer: Self-pay | Admitting: *Deleted

## 2020-11-28 NOTE — Patient Outreach (Signed)
[facility]) Care Management  11/28/2020  Glen Carter Sep 04, 1965 [id number]   [facility] outreach follow up to [facility] stroke referred patient  Mr Glen Carter was referred on 11/03/20 D/c from [facility], not on APL RED ON EMMI ALERT-patient Day #9Date: Friday October 31 2020 1301 Red Alert Reason:feeling worse overall? Yes New problems walking/talking/speaking/seeing? Yes    Insurance:[employer] [facility])  [facility] admissions x1ED visits x 1in the last 6 months  Lat admission 10/14/20- 10/20/20 subdural hematoma  Outreach successful  Preference is to outreach to patient mobile number [phone] as verified by pt    Patient is able to verify HIPAA [facility] Portability and Accountability Act) identifiers Reviewed and addressed the purpose of the follow up call with the patient  Consent: [facility]) RN CM reviewed [facility] services with patient. Patient gave verbal consent for services.   Follow up assessment Mr Glen Carter reports he continues to improve  He denies medial concerns and any care coordination needs Still not interested in a pcp  Reports he is now driving He agrees to [facility] case closure    plans Patient requests no follow up at this time Not on APL, No identifiable needs Pt encouraged to return a call to [facility] RN CM prn Goals Addressed              This Visit's Progress     Patient Stated   .  COMPLETED: ([facility]) Manage Chronic Pain (pt-stated)   On track     Timeframe:  Short-Term Goal Priority:  Low Start Date:        11/14/20                     Expected End Date:   11/28/20                    Follow Up Date 11/28/20   - prioritize tasks for the day - work slower and less intense when having pain     Notes:  11/28/20  reports he continues to improve  He denies medial concerns and any care coordination needs Still not interested in a pcp  Reports he is now driving         Glen Carter L.  Noelle Penner, RN, BSN, CCM [facility] Telephonic Care Management Care Coordinator Office number [phone] Main [facility] number [phone] Fax number [phone]

## 2020-12-30 ENCOUNTER — Other Ambulatory Visit: Payer: Self-pay | Admitting: Physician Assistant

## 2020-12-30 ENCOUNTER — Other Ambulatory Visit (HOSPITAL_COMMUNITY): Payer: Self-pay | Admitting: Physician Assistant

## 2020-12-30 DIAGNOSIS — I609 Nontraumatic subarachnoid hemorrhage, unspecified: Secondary | ICD-10-CM

## 2021-01-06 ENCOUNTER — Ambulatory Visit (HOSPITAL_COMMUNITY)
Admission: RE | Admit: 2021-01-06 | Discharge: 2021-01-06 | Disposition: A | Discharge status: Home / Self Care | Payer: Commercial Managed Care - PPO | Source: Ambulatory Visit | Attending: Physician Assistant | Admitting: Physician Assistant

## 2021-01-06 ENCOUNTER — Other Ambulatory Visit: Payer: Self-pay

## 2021-01-06 DIAGNOSIS — I609 Nontraumatic subarachnoid hemorrhage, unspecified: Secondary | ICD-10-CM | POA: Insufficient documentation

## 2021-01-06 IMAGING — MR MR MRA HEAD W/O CM
1 series · 19 of 48 positions shown · non-contrast
Comparison: Prior CTA from [DATE] and arteriogram from
[DATE].

CLINICAL DATA: Follow-up examination for subarachnoid hemorrhage,
previous negative CTA and arteriogram.

EXAM:
MRA HEAD WITHOUT CONTRAST
TECHNIQUE: Angiographic images of the Circle of Willis were acquired using MRA
technique without intravenous contrast.

[Series 8: TOF · axial · 0.6mm · 0.35mm/px · z∈[-75,+22]mm · 19 of 172 slices shown]
[im 1/172]
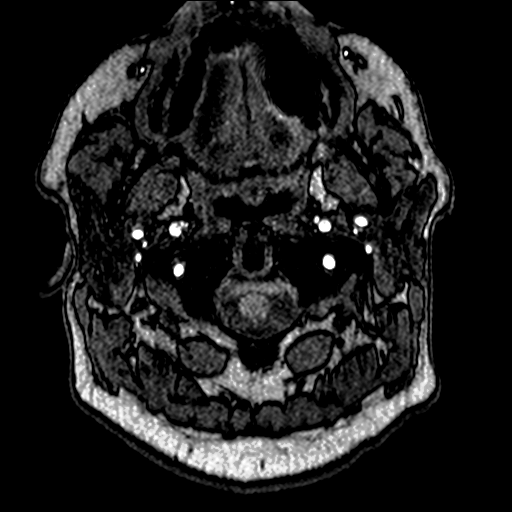
[im 4/172]
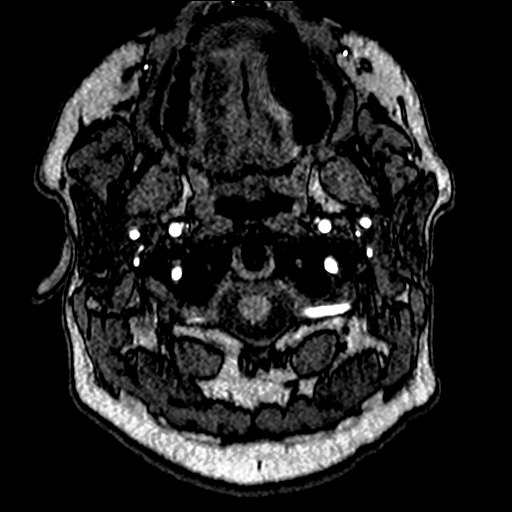
[im 8/172]
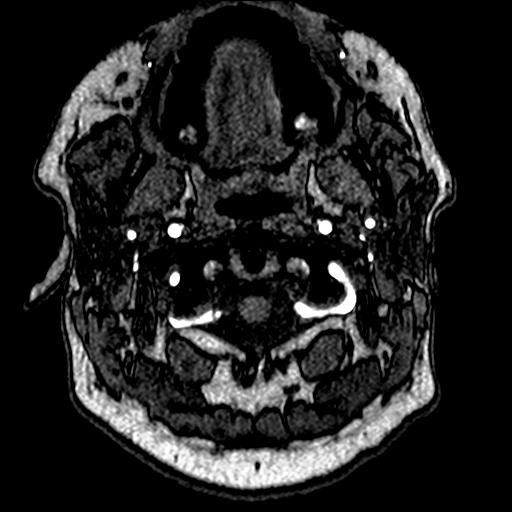
[im 11/172]
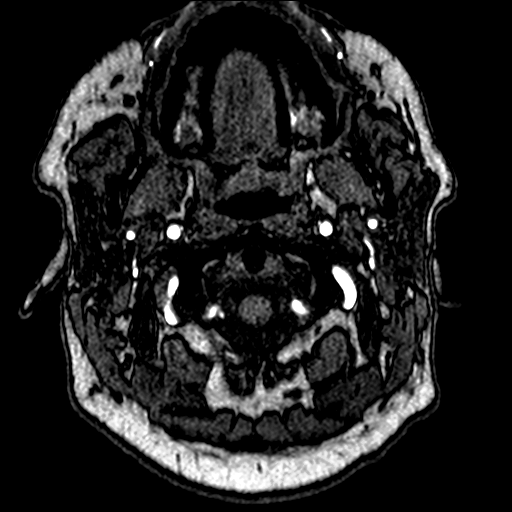
[im 15/172]
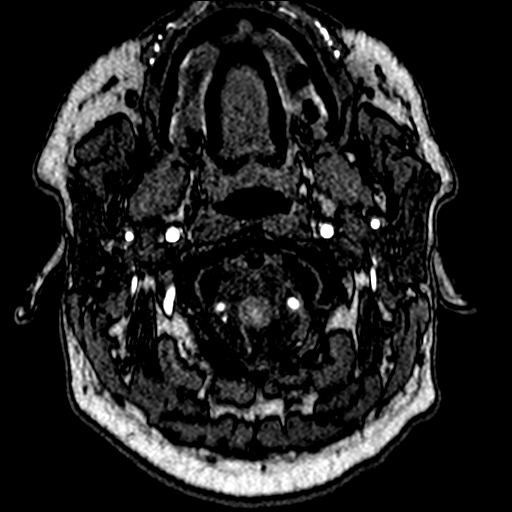
[im 19/172]
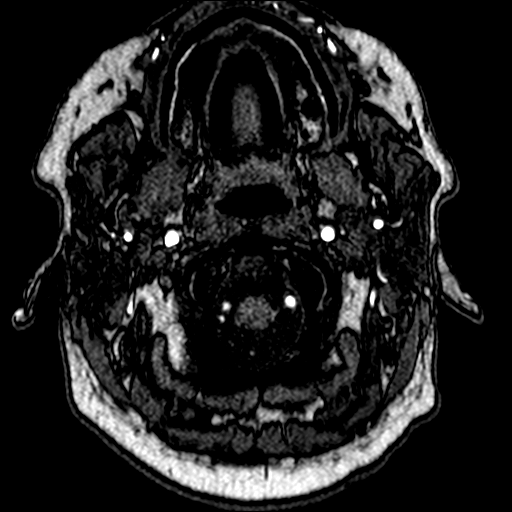
[im 22/172]
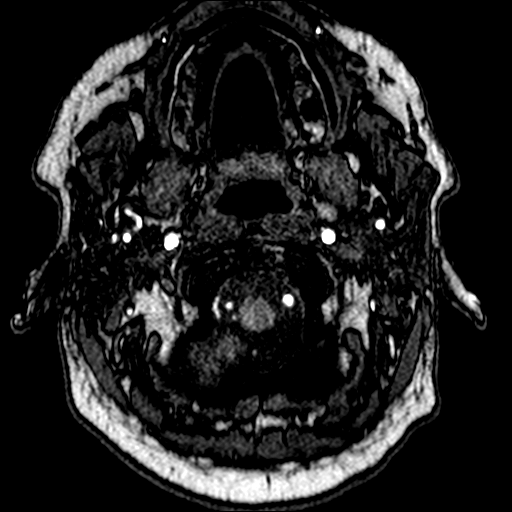
[im 26/172]
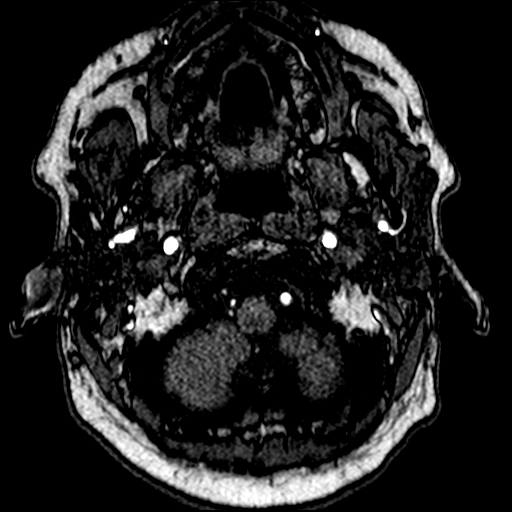
[im 30/172]
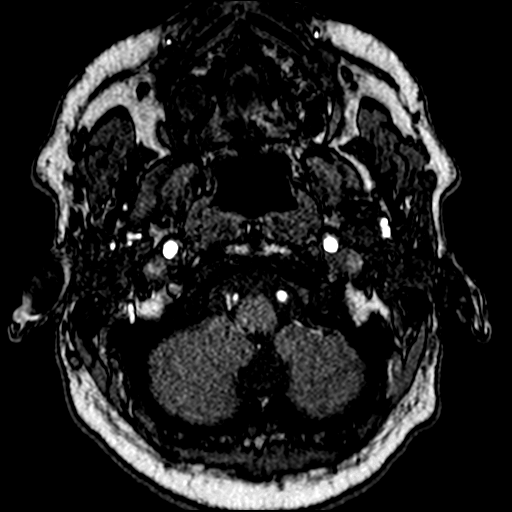
[im 33/172]
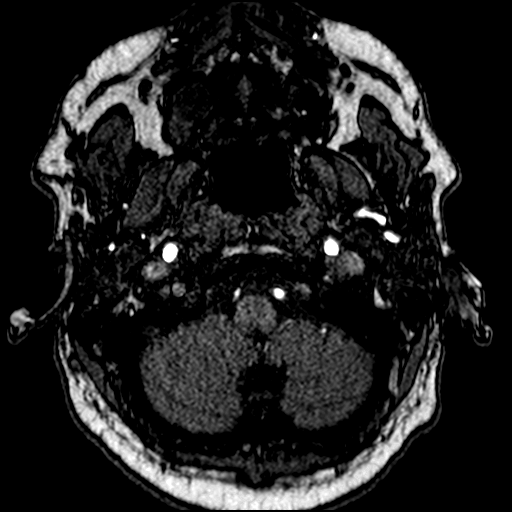
[im 37/172]
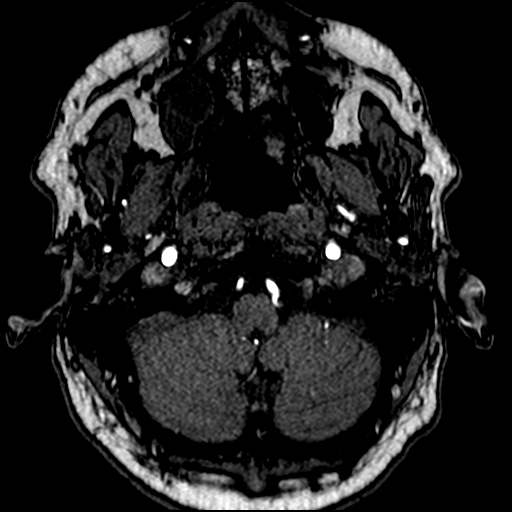
[im 55/172]
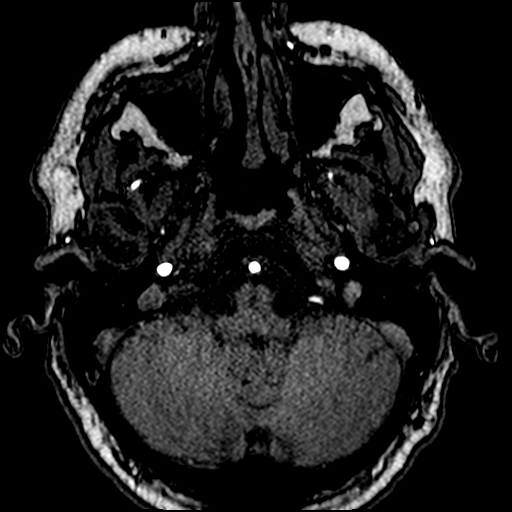
[im 77/172]
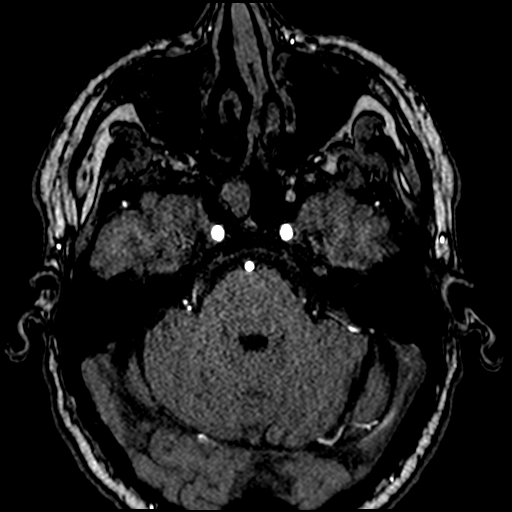
[im 88/172]
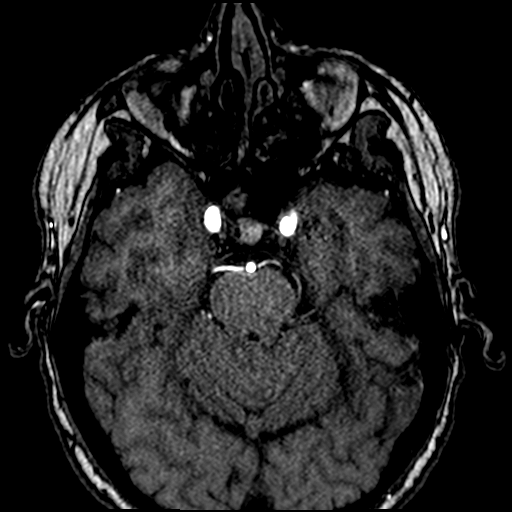
[im 99/172]
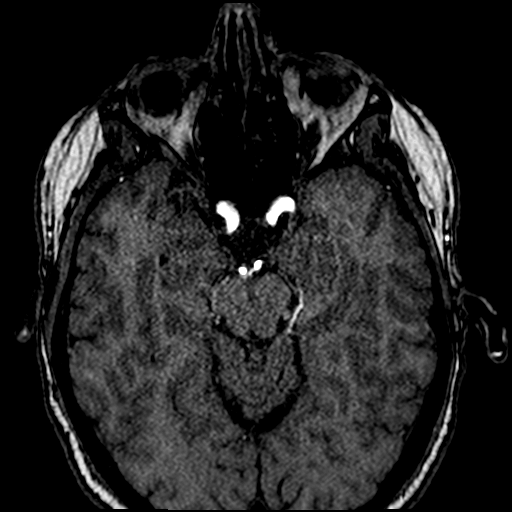
[im 121/172]
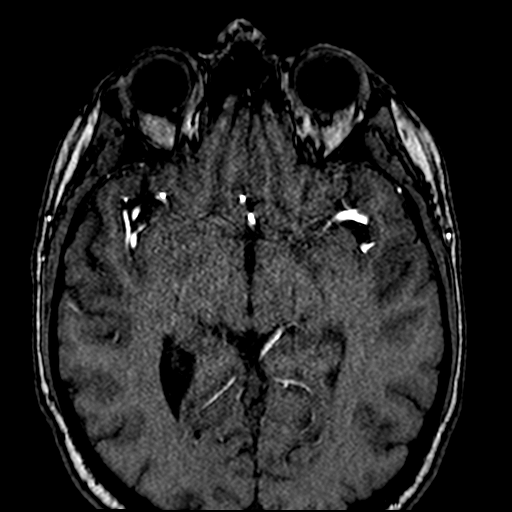
[im 142/172]
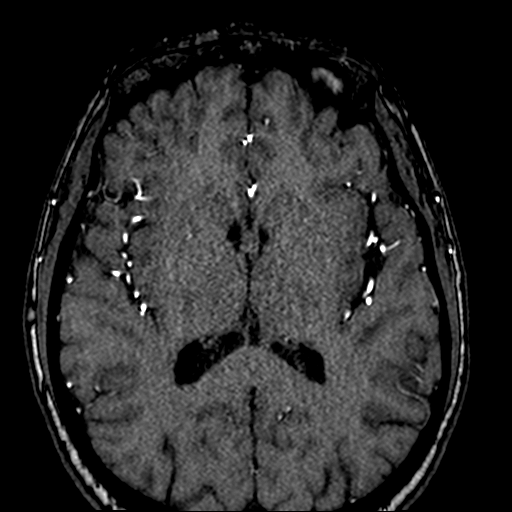
[im 146/172]
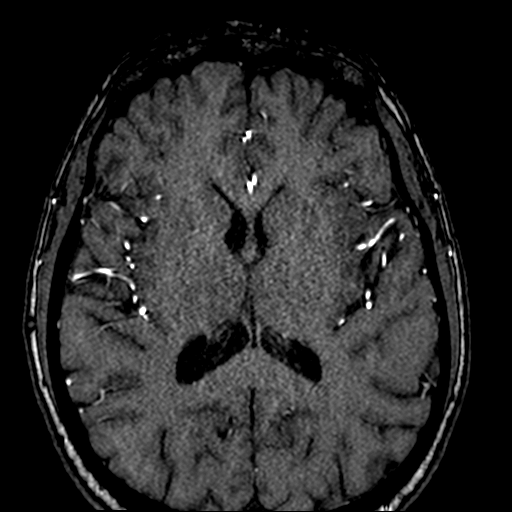
[im 164/172]
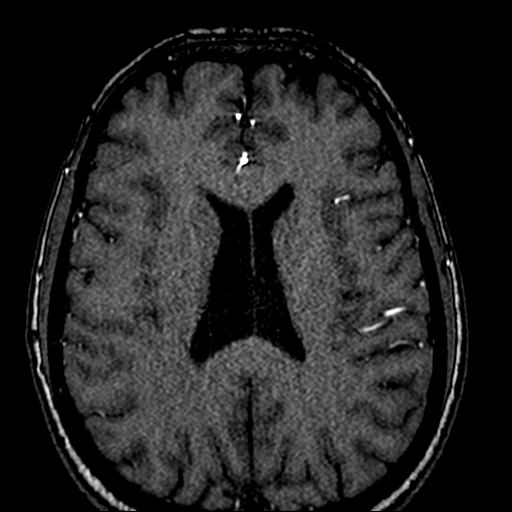

[19 of 48 positions shown; findings below may reference images not displayed]

FINDINGS: Anterior circulation: Both internal carotid arteries widely patent
to the termini without stenosis. Origins of the ophthalmic arteries
normal. A1 segments widely patent. Normal anterior communicating
artery complex. Both anterior cerebral arteries widely patent to
their distal aspects without stenosis. No M1 stenosis or occlusion.
Normal MCA bifurcations. Distal MCA branches well perfused and
symmetric.

Posterior circulation: Both V4 segments patent to the
vertebrobasilar junction without stenosis. Left vertebral artery
slightly dominant. Both PICA origins patent and normal. Basilar
widely patent to its distal aspect without stenosis. Superior
cerebellar arteries patent bilaterally. Both PCAs primarily supplied
via the basilar and are well perfused to there distal aspects.

Anatomic variants: None significant. No intracranial aneurysm, AVM,
or other vascular abnormality.

Other: None.
IMPRESSION: Normal intracranial MRA. No intracranial aneurysm, AVM, or other
vascular abnormality to explain prior subarachnoid hemorrhage.

## 2022-07-29 ENCOUNTER — Other Ambulatory Visit (HOSPITAL_COMMUNITY): Payer: Self-pay

## 2022-07-29 MED ORDER — BLOOD PRESSURE MONITOR MISC
0 refills | Status: AC
  Filled 2022-07-29: qty 1, 90d supply, fill #0

## 2022-07-29 MED ORDER — SERTRALINE HCL 25 MG PO TABS
25.0000 mg | ORAL_TABLET | Freq: Every day | ORAL | 2 refills | Status: DC
  Filled 2022-07-29: qty 30, 30d supply, fill #0
  Filled 2022-08-17: qty 30, 30d supply, fill #1

## 2022-08-05 ENCOUNTER — Other Ambulatory Visit (HOSPITAL_COMMUNITY): Payer: Self-pay

## 2022-08-05 MED ORDER — SERTRALINE HCL 50 MG PO TABS
50.0000 mg | ORAL_TABLET | Freq: Every day | ORAL | 2 refills | Status: DC
  Filled 2022-08-05 – 2022-08-17 (×3): qty 30, 30d supply, fill #0
  Filled 2022-11-16: qty 30, 30d supply, fill #1
  Filled 2022-12-17: qty 30, 30d supply, fill #2

## 2022-08-05 MED ORDER — ATORVASTATIN CALCIUM 40 MG PO TABS
40.0000 mg | ORAL_TABLET | Freq: Every day | ORAL | 3 refills | Status: DC
  Filled 2022-08-05: qty 30, 30d supply, fill #0

## 2022-08-17 ENCOUNTER — Other Ambulatory Visit (HOSPITAL_COMMUNITY): Payer: Self-pay

## 2022-08-17 MED ORDER — GUAIFENESIN-CODEINE 100-10 MG/5ML PO SOLN
ORAL | 0 refills | Status: DC
  Filled 2022-08-17: qty 120, 3d supply, fill #0

## 2022-08-23 ENCOUNTER — Other Ambulatory Visit (HOSPITAL_COMMUNITY): Payer: Self-pay

## 2022-08-23 MED ORDER — SERTRALINE HCL 50 MG PO TABS
50.0000 mg | ORAL_TABLET | Freq: Every day | ORAL | 2 refills | Status: DC
  Filled 2022-08-23 – 2022-09-13 (×2): qty 30, 30d supply, fill #0
  Filled 2022-10-16: qty 30, 30d supply, fill #1

## 2022-08-31 NOTE — Progress Notes (Signed)
Synopsis: Referred for shortness of breath by Armanda Heritage, NP  Subjective:   PATIENT ID: Glen Carter GENDER: male DOB: 05-29-1966, MRN: [id number]  No chief complaint on file.  57yM with history of SAH, smoking referred for dyspnea  Otherwise pertinent review of systems is negative.  Past Medical History:  Diagnosis Date   Acute back pain    Migraine    Migraines    SAH (subarachnoid hemorrhage) (HCC)      No family history on file.   Past Surgical History:  Procedure Laterality Date   BACK SURGERY     IR ANGIO INTRA EXTRACRAN SEL INTERNAL CAROTID BILAT MOD SED  10/20/2020   IR ANGIO VERTEBRAL SEL SUBCLAVIAN INNOMINATE BILAT MOD SED  10/20/2020    Social History   Socioeconomic History   Marital status: Married    Spouse name: amy    Number of children: Not on file   Years of education: Not on file   Highest education level: Not on file  Occupational History    Comment: [employer]   Tobacco Use   Smoking status: Every Day    Packs/day: 1.00    Types: Cigarettes   Smokeless tobacco: Never  Substance and Sexual Activity   Alcohol use: No   Drug use: Not on file   Sexual activity: Yes  Other Topics Concern   Not on file  Social History Narrative   Not on file   Social Determinants of Health   Financial Resource Strain: Not on file  Food Insecurity: No Food Insecurity (11/14/2020)   Hunger Vital Sign    Worried About Running Out of Food in the Last Year: Never true    Ran Out of Food in the Last Year: Never true  Transportation Needs: No Transportation Needs (11/14/2020)   PRAPARE - Hydrologist (Medical): No    Lack of Transportation (Non-Medical): No  Physical Activity: Not on file  Stress: No Stress Concern Present (11/14/2020)   Cecilia    Feeling of Stress : Only a little  Social Connections: Not on file  Intimate Partner Violence:  Not At Risk (11/14/2020)   Humiliation, Afraid, Rape, and Kick questionnaire    Fear of Current or Ex-Partner: No    Emotionally Abused: No    Physically Abused: No    Sexually Abused: No     No Known Allergies   Outpatient Medications Prior to Visit  Medication Sig Dispense Refill   atorvastatin (LIPITOR) 40 MG tablet Take 1 tablet (40 mg total) by mouth daily. 90 tablet 3   Blood Pressure Monitor MISC Use as directed 1 each 0   diphenhydramine-acetaminophen (TYLENOL PM EXTRA STRENGTH) 25-500 MG TABS tablet Take by mouth.     guaiFENesin-codeine 100-10 MG/5ML syrup Take 5 - 10 mLs by mouth 4 times a day as needed for cough for up to 10 days. (Max Daily Amount: 40 mLs) 120 mL 0   methylPREDNISolone (MEDROL) 4 MG TBPK tablet Take by mouth.     naproxen sodium (ALEVE) 220 MG tablet Take 220-440 mg by mouth as needed (pain).     oxyCODONE (OXY IR/ROXICODONE) 5 MG immediate release tablet Take by mouth.     sertraline (ZOLOFT) 25 MG tablet Take 1 tablet (25 mg total) by mouth daily. 30 tablet 2   sertraline (ZOLOFT) 50 MG tablet Take 1 tablet (50 mg total) by mouth daily. 30 tablet 2  sertraline (ZOLOFT) 50 MG tablet Take 1 tablet (50 mg dose) by mouth daily. 30 tablet 2   No facility-administered medications prior to visit.       Objective:   Physical Exam:  General appearance: 57 y.o., male, NAD, conversant  Eyes: anicteric sclerae; PERRL, tracking appropriately HENT: NCAT; MMM Neck: Trachea midline; no lymphadenopathy, no JVD Lungs: CTAB, no crackles, no wheeze, with normal respiratory effort CV: RRR, no murmur  Abdomen: Soft, non-tender; non-distended, BS present  Extremities: No peripheral edema, warm Skin: Normal turgor and texture; no rash Psych: Appropriate affect Neuro: Alert and oriented to person and place, no focal deficit     There were no vitals filed for this visit.   on *** LPM *** RA BMI Readings from Last 3 Encounters:  11/28/20 23.30 kg/m  10/14/20  23.30 kg/m   Wt Readings from Last 3 Encounters:  11/28/20 140 lb (63.5 kg)  10/14/20 140 lb (63.5 kg)  02/13/12 110 lb (49.9 kg)     CBC    Component Value Date/Time   WBC 12.7 (H) 10/14/2020 1850   RBC 5.43 10/14/2020 1850   HGB 15.7 10/14/2020 1850   HCT 46.5 10/14/2020 1850   PLT 321 10/14/2020 1850   MCV 85.6 10/14/2020 1850   MCH 28.9 10/14/2020 1850   MCHC 33.8 10/14/2020 1850   RDW 12.4 10/14/2020 1850   LYMPHSABS 1.3 10/14/2020 1228   MONOABS 0.7 10/14/2020 1228   EOSABS 0.3 10/14/2020 1228   BASOSABS 0.1 10/14/2020 1228    ***  Chest Imaging: CTA Chest 07/30/22 with mild extent paraseptal emphysema and bronchial wall thickening  Pulmonary Functions Testing Results:     No data to display          FeNO: ***  Pathology: ***  Echocardiogram: ***  Heart Catheterization: ***    Assessment & Plan:    Plan:      Maryjane Hurter, MD [facility] 08/31/2022 8:16 PM

## 2022-09-01 ENCOUNTER — Other Ambulatory Visit (HOSPITAL_COMMUNITY): Payer: Self-pay

## 2022-09-01 ENCOUNTER — Encounter: Payer: Self-pay | Admitting: Student

## 2022-09-01 ENCOUNTER — Ambulatory Visit: Payer: Commercial Managed Care - PPO | Admitting: Student

## 2022-09-01 VITALS — BP 110/58 | HR 58 | Temp 97.5°F | Ht 64.0 in | Wt 141.2 lb

## 2022-09-01 DIAGNOSIS — Z87891 Personal history of nicotine dependence: Secondary | ICD-10-CM

## 2022-09-01 DIAGNOSIS — R0609 Other forms of dyspnea: Secondary | ICD-10-CM | POA: Diagnosis not present

## 2022-09-01 MED ORDER — ALBUTEROL SULFATE HFA 108 (90 BASE) MCG/ACT IN AERS
2.0000 | INHALATION_SPRAY | Freq: Four times a day (QID) | RESPIRATORY_TRACT | 6 refills | Status: AC | PRN
  Filled 2022-09-01: qty 6.7, 25d supply, fill #0
  Filled 2022-11-18: qty 6.7, 25d supply, fill #1
  Filled 2023-04-12 – 2023-04-19 (×2): qty 6.7, 25d supply, fill #2

## 2022-09-01 MED ORDER — TRELEGY ELLIPTA 100-62.5-25 MCG/ACT IN AEPB: 1.0000 | INHALATION_SPRAY | Freq: Every day | RESPIRATORY_TRACT | 0 refills | Status: DC

## 2022-09-01 NOTE — Patient Instructions (Addendum)
-   try trelegy 1 puff once daily everyday, rinse mouth and brush tongue/teeth after use - albuterol 1-2 puffs as needed and can use 5-20 minutes before exercise - this is a rescue inhaler - PFTs (breathing tests) in 2 months on same day as next visit - think about whether or not you'd like to start lung cancer screening 07/2023

## 2022-09-03 ENCOUNTER — Other Ambulatory Visit (HOSPITAL_COMMUNITY): Payer: Self-pay

## 2022-09-13 ENCOUNTER — Encounter (HOSPITAL_BASED_OUTPATIENT_CLINIC_OR_DEPARTMENT_OTHER): Payer: Self-pay

## 2022-09-13 ENCOUNTER — Emergency Department (HOSPITAL_BASED_OUTPATIENT_CLINIC_OR_DEPARTMENT_OTHER): Payer: Worker's Compensation

## 2022-09-13 ENCOUNTER — Emergency Department (HOSPITAL_BASED_OUTPATIENT_CLINIC_OR_DEPARTMENT_OTHER)
Admission: EM | Admit: 2022-09-13 | Discharge: 2022-09-13 | Disposition: A | Discharge status: Home / Self Care | Payer: Worker's Compensation | Attending: Emergency Medicine | Admitting: Emergency Medicine

## 2022-09-13 ENCOUNTER — Other Ambulatory Visit (HOSPITAL_COMMUNITY): Payer: Self-pay

## 2022-09-13 DIAGNOSIS — S6992XA Unspecified injury of left wrist, hand and finger(s), initial encounter: Secondary | ICD-10-CM | POA: Diagnosis present

## 2022-09-13 DIAGNOSIS — W208XXA Other cause of strike by thrown, projected or falling object, initial encounter: Secondary | ICD-10-CM | POA: Diagnosis not present

## 2022-09-13 DIAGNOSIS — Y990 Civilian activity done for income or pay: Secondary | ICD-10-CM | POA: Insufficient documentation

## 2022-09-13 DIAGNOSIS — S62641B Nondisplaced fracture of proximal phalanx of left index finger, initial encounter for open fracture: Secondary | ICD-10-CM | POA: Insufficient documentation

## 2022-09-13 LAB — BASIC METABOLIC PANEL
Anion gap: 6 (ref 5–15)
BUN: 20 mg/dL (ref 6–20)
CO2: 25 mmol/L (ref 22–32)
Calcium: 8.4 mg/dL — ABNORMAL LOW (ref 8.9–10.3)
Chloride: 105 mmol/L (ref 98–111)
Creatinine, Ser: 1.15 mg/dL (ref 0.61–1.24)
GFR, Estimated: >60 mL/min (ref >60)
Glucose, Bld: 165 mg/dL — ABNORMAL HIGH (ref 70–99)
Potassium: 4 mmol/L (ref 3.5–5.1)
Sodium: 136 mmol/L (ref 135–145)

## 2022-09-13 LAB — CBC WITH DIFFERENTIAL/PLATELET
Abs Immature Granulocytes: 0.03 10*3/uL (ref 0.00–0.07)
Basophils Absolute: 0.1 10*3/uL (ref 0.0–0.1)
Basophils Relative: 1 %
Eosinophils Absolute: 0.2 10*3/uL (ref 0.0–0.5)
Eosinophils Relative: 2 %
HCT: 45.5 % (ref 39.0–52.0)
Hemoglobin: 15.2 g/dL (ref 13.0–17.0)
Immature Granulocytes: 0 %
Lymphocytes Relative: 12 %
Lymphs Abs: 1 10*3/uL (ref 0.7–4.0)
MCH: 28.6 pg (ref 26.0–34.0)
MCHC: 33.4 g/dL (ref 30.0–36.0)
MCV: 85.5 fL (ref 80.0–100.0)
Monocytes Absolute: 0.6 10*3/uL (ref 0.1–1.0)
Monocytes Relative: 7 %
Neutro Abs: 6.6 10*3/uL (ref 1.7–7.7)
Neutrophils Relative %: 78 %
Platelets: 277 10*3/uL (ref 150–400)
RBC: 5.32 MIL/uL (ref 4.22–5.81)
RDW: 12.4 % (ref 11.5–15.5)
WBC: 8.5 10*3/uL (ref 4.0–10.5)
nRBC: 0 % (ref 0.0–0.2)

## 2022-09-13 LAB — PROTIME-INR
INR: 1 (ref 0.8–1.2)
Prothrombin Time: 12.8 seconds (ref 11.4–15.2)

## 2022-09-13 MED ORDER — HYDROCODONE-ACETAMINOPHEN 5-325 MG PO TABS
2.0000 | ORAL_TABLET | ORAL | 0 refills | Status: DC | PRN
  Filled 2022-09-13: qty 10, 1d supply, fill #0

## 2022-09-13 MED ORDER — HYDROCODONE-ACETAMINOPHEN 5-325 MG PO TABS
1.0000 | ORAL_TABLET | Freq: Once | ORAL | Status: AC
  Administered 2022-09-13: 1 via ORAL
  Filled 2022-09-13: qty 1

## 2022-09-13 MED ORDER — LIDOCAINE HCL (PF) 1 % IJ SOLN
10.0000 mL | Freq: Once | INTRAMUSCULAR | Status: AC
  Administered 2022-09-13: 10 mL
  Filled 2022-09-13: qty 10

## 2022-09-13 MED ORDER — CEFAZOLIN SODIUM-DEXTROSE 2-4 GM/100ML-% IV SOLN
2.0000 g | INTRAVENOUS | Status: AC
  Administered 2022-09-13: 2 g via INTRAVENOUS
  Filled 2022-09-13: qty 100

## 2022-09-13 MED ORDER — CEPHALEXIN 500 MG PO CAPS
500.0000 mg | ORAL_CAPSULE | Freq: Four times a day (QID) | ORAL | 0 refills | Status: DC
  Filled 2022-09-13: qty 20, 5d supply, fill #0

## 2022-09-13 NOTE — Discharge Instructions (Addendum)
Please follow-up with Dr. Greta Doom of hand surgery in clinic this Wednesday.  [name] call to schedule appointment with his office later today.

## 2022-09-13 NOTE — ED Provider Notes (Addendum)
EMERGENCY DEPARTMENT AT [facility] Provider Note   CSN: [id number] Arrival date & time: 09/13/22  [id number]     History  Chief Complaint  Patient presents with   Hand Injury    Glen Carter is a 57 y.o. male.   Hand Injury    57 year old right handed male presenting to the emergency department after a crush injury to the hand.  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  He sustained a laceration to the flexor aspect of the left index finger along the MCP joint.  He thinks his last tetanus was up to date within the last 60 or 6 years.  He sustained abrasions to the dorsum of his hand and fingers.  Home Medications Prior to Admission medications   Medication Sig Start Date End Date Taking? Authorizing Provider  cephALEXin (KEFLEX) 500 MG capsule Take 1 capsule (500 mg total) by mouth 4 (four) times daily. 09/13/22  Yes Regan Lemming, MD  HYDROcodone-acetaminophen (NORCO/VICODIN) 5-325 MG tablet Take 2 tablets by mouth every 4 (four) hours as needed. 09/13/22  Yes Regan Lemming, MD  acetaminophen (TYLENOL) 500 MG tablet Take 1,000 mg by mouth every 6 (six) hours as needed for headache.    [provider]  albuterol (VENTOLIN HFA) 108 (90 Base) MCG/ACT inhaler Inhale 2 puffs into the lungs every 6 (six) hours as needed for wheezing or shortness of breath. 09/01/22   Maryjane Hurter, MD  atorvastatin (LIPITOR) 40 MG tablet Take 1 tablet (40 mg total) by mouth daily. Patient not taking: Reported on 09/01/2022 08/05/22     Blood Pressure Monitor MISC Use as directed 07/29/22     diphenhydramine-acetaminophen (TYLENOL PM EXTRA STRENGTH) 25-500 MG TABS tablet Take by mouth. Patient not taking: Reported on 09/01/2022    [provider]  Fluticasone-Umeclidin-Vilant (TRELEGY ELLIPTA) 100-62.5-25 MCG/ACT AEPB Inhale 1 puff into the lungs daily. 09/01/22    Maryjane Hurter, MD  guaiFENesin-codeine 100-10 MG/5ML syrup Take 5 - 10 mLs by mouth 4 times a day as needed for cough for up to 10 days. (Max Daily Amount: 40 mLs) Patient not taking: Reported on 09/01/2022 08/17/22     ibuprofen (ADVIL) 200 MG tablet Take 200 mg by mouth every 6 (six) hours as needed for headache.    [provider]  methylPREDNISolone (MEDROL) 4 MG TBPK tablet Take by mouth. Patient not taking: Reported on 09/01/2022 11/12/20   [provider]  naproxen sodium (ALEVE) 220 MG tablet Take 220-440 mg by mouth as needed (pain). Patient not taking: Reported on 09/01/2022    [provider]  oxyCODONE (OXY IR/ROXICODONE) 5 MG immediate release tablet Take by mouth. Patient not taking: Reported on 09/01/2022 11/12/20   [provider]  sertraline (ZOLOFT) 50 MG tablet Take 1 tablet (50 mg total) by mouth daily. Patient not taking: Reported on 09/01/2022 08/05/22     sertraline (ZOLOFT) 50 MG tablet Take 1 tablet (50 mg dose) by mouth daily. 08/22/22         Allergies    Patient has no known allergies.    Review of Systems   Review of Systems  All other systems reviewed and are negative.   Physical Exam Updated Vital Signs BP (!) 149/80 (BP Location: Right Arm)   Pulse 64   Temp 98.1 F (36.7 C) (Oral)   Resp 18  Ht '5\' 4"'$  (1.626 m)   Wt 65.8 kg   SpO2 97%   BMI 24.89 kg/m  Physical Exam Vitals and nursing note reviewed.  Constitutional:      General: He is not in acute distress. HENT:     Head: Normocephalic and atraumatic.  Eyes:     Conjunctiva/sclera: Conjunctivae normal.     Pupils: Pupils are equal, round, and reactive to light.  Cardiovascular:     Rate and Rhythm: Normal rate and regular rhythm.  Pulmonary:     Effort: Pulmonary effort is normal. No respiratory distress.  Abdominal:     General: There is no distension.     Tenderness: There is no guarding.  Musculoskeletal:        General: No deformity or signs of  injury.     Cervical back: Neck supple.     Comments: 1cm laceration to the palmar aspect of the proximal left index finger, hemostatic, abrasions to the dorsum of the index and long finger. Left hand NVI, slightly decreased sensation to the lateral aspect of the left finger otherwise intact  Skin:    Findings: No lesion or rash.  Neurological:     General: No focal deficit present.     Mental Status: He is alert. Mental status is at baseline.     Comments: Decree sensation to light touch along the lateral aspect of the left index finger, motor function intact      ED Results / Procedures / Treatments   Labs (all labs ordered are listed, but only abnormal results are displayed) Labs Reviewed  BASIC METABOLIC PANEL - Abnormal; Notable for the following components:      Result Value   Glucose, Bld 165 (*)    Calcium 8.4 (*)    All other components within normal limits  CBC WITH DIFFERENTIAL/PLATELET  PROTIME-INR    EKG None  Radiology DG Hand Complete Left  Result Date: 09/13/2022 CLINICAL DATA:  Trauma. Equipment fell on left hand with laceration across the index finger. EXAM: LEFT HAND - COMPLETE 3+ VIEW COMPARISON:  None Available. FINDINGS: Three views. Acute, oblique fracture of the index proximal phalanx shaft with minimal displacement. Radiopaque debris is seen within the volar soft tissues at the base of the index digit. IMPRESSION: Acute, oblique fracture of the index proximal phalanx shaft with minimal displacement. Radiopaque debris within the volar soft tissues at the base of the index digit. Electronically Signed   By: Emmit Alexanders M.D.   On: 09/13/2022 08:09    Procedures .Marland KitchenLaceration Repair  Date/Time: 09/13/2022 9:55 AM  Performed by: Regan Lemming, MD Authorized by: Regan Lemming, MD   Consent:    Consent obtained:  Verbal   Consent given by:  Patient   Risks discussed:  Infection, pain, poor cosmetic result and need for additional repair Laceration  details:    Location:  Finger   Finger location:  L index finger   Length (cm):  2   Depth (mm):  3 Exploration:    Imaging obtained: x-ray   Treatment:    Irrigation volume:  1L   Irrigation method:  Pressure wash Skin repair:    Repair method:  Sutures   Suture size:  5-0   Suture material:  Prolene   Number of sutures:  4 Approximation:    Approximation:  Loose Repair type:    Repair type:  Intermediate Post-procedure details:    Dressing:  Sterile dressing   Procedure completion:  Tolerated .Marland KitchenLaceration Repair  Date/Time: 09/13/2022 9:56 AM  Performed by: Regan Lemming, MD Authorized by: Regan Lemming, MD   Consent:    Consent obtained:  Verbal   Consent given by:  Patient   Risks discussed:  Infection, pain and poor cosmetic result Laceration details:    Location:  Finger   Finger location:  L index finger   Length (cm):  0.5 Exploration:    Imaging obtained: x-ray   Treatment:    Irrigation volume:  250 Skin repair:    Repair method:  Sutures   Suture size:  5-0   Suture material:  Prolene   Suture technique:  Simple interrupted   Number of sutures:  2 Approximation:    Approximation:  Close Repair type:    Repair type:  Simple Post-procedure details:    Dressing:  Antibiotic ointment and sterile dressing   Procedure completion:  Tolerated .Ortho Injury Treatment  Date/Time: 09/13/2022 2:26 PM  Performed by: Regan Lemming, MD Authorized by: Regan Lemming, MD   Consent:    Consent obtained:  Verbal   Consent given by:  Patient   Risks discussed:  FractureInjury location: hand Location details: left hand Injury type: fracture Pre-procedure distal perfusion: normal Pre-procedure neurological function: diminished Pre-procedure range of motion: normal Immobilization: splint Splint type: radial gutter Splint Applied by: ED Nurse Post-procedure distal perfusion: normal Post-procedure neurological function: diminished Post-procedure range of  motion: normal       Medications Ordered in ED Medications  lidocaine (PF) (XYLOCAINE) 1 % injection 10 mL (10 mLs Infiltration Given 09/13/22 0803)  ceFAZolin (ANCEF) IVPB 2g/100 mL premix (0 g Intravenous Stopped 09/13/22 0858)  HYDROcodone-acetaminophen (NORCO/VICODIN) 5-325 MG per tablet 1 tablet (1 tablet Oral Given 09/13/22 [id number])    ED Course/ Medical Decision Making/ A&P                             Medical Decision Making Amount and/or Complexity of Data Reviewed Labs: ordered. Radiology: ordered.  Risk Prescription drug management.     57 year old right handed male presenting to the emergency department after a crush injury to the hand.  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  He sustained a laceration to the flexor aspect of the left index finger along the MCP joint.  He thinks his last tetanus was up to date within the last 64 or 6 years.  He sustained abrasions to the dorsum of his hand and fingers.  On arrival, the patient was vitally stable, left finger neurovascularly intact, decreased sensation to light touch along the lateral aspect of the finger but motor function intact.  X-ray imaging revealed likely open fracture.  Ancef ordered.  The patient's tetanus is up-to-date.  [id number] Spoke with Orion Crook, PA, who will staff with Dr. Greta Doom of hand surgery.  Per hand: Ancef, I&D and close there, radial gutter splint. Home on Keflex. See Spears in office this week, potential surgery on phalanx.   Irrigation and drainage of the wound was performed bedside with 1 L of normal saline, pressure wash performed, wound explored through full range of motion.  The wound was closed loosely with 5-0 Prolene sutures.  Additional laceration repair performed to the dorsum of the hand.  Infectious return precautions were provided.  The patient was placed in a radial gutter splint.   He was discharged on Norco for  pain control and Keflex for antibiotic prophylaxis.  Plan for follow-up with Dr. Greta Doom in hand surgery clinic on Wednesday.    Final Clinical Impression(s) / ED Diagnoses Final diagnoses:  Open nondisplaced fracture of proximal phalanx of left index finger, initial encounter    Rx / DC Orders ED Discharge Orders          Ordered    HYDROcodone-acetaminophen (NORCO/VICODIN) 5-325 MG tablet  Every 4 hours PRN        09/13/22 1001    cephALEXin (KEFLEX) 500 MG capsule  4 times daily        09/13/22 1001              Regan Lemming, MD 09/13/22 1002    Regan Lemming, MD 09/13/22 1427

## 2022-09-13 NOTE — ED Notes (Signed)
Left hand cleaned, xeroform applied per ED MD orders, finger splint also applied for comfort. Tolerated very well, bleeding is controlled as well.

## 2022-09-13 NOTE — ED Notes (Signed)
Lab tubes obtained and to the lab as per ED MD orders

## 2022-09-13 NOTE — ED Notes (Addendum)
Presents to ED with "crush" type injury to left index finger, active bleeding is noted, area cleaned and new guaze applied. States has normal sensation and movement of left index finger. Able to flex and extend index finger on left without difficulty. Denies any blood thinners.

## 2022-09-13 NOTE — ED Triage Notes (Addendum)
Was at work and a big roller fell onto left hand, laceration and pain to left index finger. Last tetanus within 10 years. Bleeding during triage.

## 2022-09-17 ENCOUNTER — Other Ambulatory Visit (HOSPITAL_COMMUNITY): Payer: Self-pay

## 2022-09-17 MED ORDER — OXYCODONE-ACETAMINOPHEN 5-325 MG PO TABS
1.0000 | ORAL_TABLET | Freq: Four times a day (QID) | ORAL | 0 refills | Status: DC
  Filled 2022-09-17: qty 20, 5d supply, fill #0

## 2022-09-20 ENCOUNTER — Other Ambulatory Visit (HOSPITAL_COMMUNITY): Payer: Self-pay

## 2022-09-27 ENCOUNTER — Other Ambulatory Visit (HOSPITAL_COMMUNITY): Payer: Self-pay

## 2022-10-15 ENCOUNTER — Other Ambulatory Visit: Payer: Self-pay | Admitting: Student

## 2022-10-15 DIAGNOSIS — R0609 Other forms of dyspnea: Secondary | ICD-10-CM

## 2022-10-16 ENCOUNTER — Other Ambulatory Visit (HOSPITAL_COMMUNITY): Payer: Self-pay

## 2022-10-17 NOTE — Progress Notes (Unsigned)
Synopsis: Referred for shortness of breath by No ref. provider found  Subjective:   PATIENT ID: Glen Carter GENDER: male DOB: April 14, 1966, MRN: [id number]  No chief complaint on file.  69yM with history of SAH, smoking 30 py quit 2016 referred for dyspnea  DOE with very light activity occasional sharp pain in his chest, has to stop an catch his breath. No significant cough. No orthopnea.   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 Lots of lint in the air there. No vaping, MJ. He has lived in [place], moved here about [place]. Dogs and a cat at home.   Interval HPI  Started on trelegy last visit   PFTs  Otherwise pertinent review of systems is negative.  Past Medical History:  Diagnosis Date   Acute back pain    Migraine    Migraines    SAH (subarachnoid hemorrhage) (HCC)      No family history on file.   Past Surgical History:  Procedure Laterality Date   BACK SURGERY     IR ANGIO INTRA EXTRACRAN SEL INTERNAL CAROTID BILAT MOD SED  10/20/2020   IR ANGIO VERTEBRAL SEL SUBCLAVIAN INNOMINATE BILAT MOD SED  10/20/2020    Social History   Socioeconomic History   Marital status: Married    Spouse name: amy    Number of children: Not on file   Years of education: Not on file   Highest education level: Not on file  Occupational History    Comment: [employer]   Tobacco Use   Smoking status: Former    Packs/day: 1.00    Years: 30.00    Additional pack years: 0.00    Total pack years: 30.00    Types: Cigarettes    Quit date: 2016    Years since quitting: 8.2   Smokeless tobacco: Never   Tobacco comments:    Patient states quit smoking in 2016.  Substance and Sexual Activity   Alcohol use: No   Drug use: Not on file   Sexual activity: Yes  Other Topics Concern   Not on file  Social History Narrative   Not on file   Social Determinants of Health   Financial Resource Strain: Not on file  Food  Insecurity: No Food Insecurity (11/14/2020)   Hunger Vital Sign    Worried About Running Out of Food in the Last Year: Never true    Ran Out of Food in the Last Year: Never true  Transportation Needs: No Transportation Needs (11/14/2020)   PRAPARE - Hydrologist (Medical): No    Lack of Transportation (Non-Medical): No  Physical Activity: Not on file  Stress: No Stress Concern Present (11/14/2020)   [place]    Feeling of Stress : Only a little  Social Connections: Not on file  Intimate Partner Violence: Not At Risk (11/14/2020)   Humiliation, Afraid, Rape, and Kick questionnaire    Fear of Current or Ex-Partner: No    Emotionally Abused: No    Physically Abused: No    Sexually Abused: No     No Known Allergies   Outpatient Medications Prior to Visit  Medication Sig Dispense Refill   acetaminophen (TYLENOL) 500 MG tablet Take 1,000 mg by mouth every 6 (six) hours as needed for headache.     albuterol (VENTOLIN HFA) 108 (90 Base) MCG/ACT inhaler Inhale 2  puffs into the lungs every 6 (six) hours as needed for wheezing or shortness of breath. 6.7 g 6   atorvastatin (LIPITOR) 40 MG tablet Take 1 tablet (40 mg total) by mouth daily. (Patient not taking: Reported on 09/01/2022) 90 tablet 3   Blood Pressure Monitor MISC Use as directed 1 each 0   cephALEXin (KEFLEX) 500 MG capsule Take 1 capsule (500 mg total) by mouth 4 (four) times daily. 20 capsule 0   diphenhydramine-acetaminophen (TYLENOL PM EXTRA STRENGTH) 25-500 MG TABS tablet Take by mouth. (Patient not taking: Reported on 09/01/2022)     Fluticasone-Umeclidin-Vilant (TRELEGY ELLIPTA) 100-62.5-25 MCG/ACT AEPB Inhale 1 puff into the lungs daily. 2 each 0   guaiFENesin-codeine 100-10 MG/5ML syrup Take 5 - 10 mLs by mouth 4 times a day as needed for cough for up to 10 days. (Max Daily Amount: 40 mLs) (Patient not taking: Reported on 09/01/2022) 120  mL 0   HYDROcodone-acetaminophen (NORCO/VICODIN) 5-325 MG tablet Take 2 tablets by mouth every 4 hours as needed. 10 tablet 0   ibuprofen (ADVIL) 200 MG tablet Take 200 mg by mouth every 6 (six) hours as needed for headache.     methylPREDNISolone (MEDROL) 4 MG TBPK tablet Take by mouth. (Patient not taking: Reported on 09/01/2022)     naproxen sodium (ALEVE) 220 MG tablet Take 220-440 mg by mouth as needed (pain). (Patient not taking: Reported on 09/01/2022)     oxyCODONE (OXY IR/ROXICODONE) 5 MG immediate release tablet Take by mouth. (Patient not taking: Reported on 09/01/2022)     oxyCODONE-acetaminophen (PERCOCET) 5-325 MG tablet Take 1 tablet by mouth every 6 (six) hours. 20 tablet 0   sertraline (ZOLOFT) 50 MG tablet Take 1 tablet (50 mg total) by mouth daily. (Patient not taking: Reported on 09/01/2022) 30 tablet 2   sertraline (ZOLOFT) 50 MG tablet Take 1 tablet (50 mg total) by mouth daily. 30 tablet 2   No facility-administered medications prior to visit.       Objective:   Physical Exam:  General appearance: 56 y.o., male, NAD, conversant  Eyes: anicteric sclerae; PERRL, tracking appropriately HENT: NCAT; MMM Neck: Trachea midline; no lymphadenopathy, no JVD Lungs: CTAB, no crackles, no wheeze, with normal respiratory effort CV: RRR, no murmur  Abdomen: Soft, non-tender; non-distended, BS present  Extremities: No peripheral edema, warm Skin: Normal turgor and texture; no rash Psych: Appropriate affect Neuro: Alert and oriented to person and place, no focal deficit     There were no vitals filed for this visit.     RA BMI Readings from Last 3 Encounters:  09/13/22 24.89 kg/m  09/01/22 24.24 kg/m  11/28/20 23.30 kg/m   Wt Readings from Last 3 Encounters:  09/13/22 145 lb (65.8 kg)  09/01/22 141 lb 3.2 oz (64 kg)  11/28/20 140 lb (63.5 kg)     CBC    Component Value Date/Time   WBC 8.5 09/13/2022 0844   RBC 5.32 09/13/2022 0844   HGB 15.2 09/13/2022 0844    HCT 45.5 09/13/2022 0844   PLT 277 09/13/2022 0844   MCV 85.5 09/13/2022 0844   MCH 28.6 09/13/2022 0844   MCHC 33.4 09/13/2022 0844   RDW 12.4 09/13/2022 0844   LYMPHSABS 1.0 09/13/2022 0844   MONOABS 0.6 09/13/2022 0844   EOSABS 0.2 09/13/2022 0844   BASOSABS 0.1 09/13/2022 0844      Chest Imaging: CTA Chest 07/30/22 with mild extent paraseptal emphysema and bronchial wall thickening  Pulmonary Functions Testing Results:  No data to display               Assessment & Plan:   # DOE # Emphysema, bronchial wall thickening At risk for COPD, we'll check PFTs, trial trelegy. If not clear that it's a pulmonary issue we'll consider referral to cardiology for coronary ct or stress testing.  # History of smoking 30 py quit 2016  Plan: - try trelegy 1 puff once daily everyday, rinse mouth and brush tongue/teeth after use - albuterol 1-2 puffs as needed and can use 5-20 minutes before exercise - this is a rescue inhaler - PFTs (breathing tests) in 2 months on same day as next visit - think about whether or not you'd like to start lung cancer screening 07/2023     Maryjane Hurter, MD [facility] 10/17/2022 3:15 PM

## 2022-10-18 ENCOUNTER — Encounter: Payer: Self-pay | Admitting: Student

## 2022-10-18 ENCOUNTER — Other Ambulatory Visit (HOSPITAL_COMMUNITY): Payer: Self-pay

## 2022-10-18 ENCOUNTER — Ambulatory Visit: Payer: Commercial Managed Care - PPO | Admitting: Student

## 2022-10-18 ENCOUNTER — Ambulatory Visit (INDEPENDENT_AMBULATORY_CARE_PROVIDER_SITE_OTHER): Payer: Commercial Managed Care - PPO | Admitting: Student

## 2022-10-18 VITALS — BP 118/64 | HR 66 | Temp 97.7°F | Ht 66.0 in | Wt 144.6 lb

## 2022-10-18 DIAGNOSIS — R0609 Other forms of dyspnea: Secondary | ICD-10-CM | POA: Diagnosis not present

## 2022-10-18 DIAGNOSIS — J4489 Other specified chronic obstructive pulmonary disease: Secondary | ICD-10-CM

## 2022-10-18 LAB — PULMONARY FUNCTION TEST
DL/VA % pred: 113 %
DL/VA: 5.06 ml/min/mmHg/L
DLCO cor % pred: 122 %
DLCO cor: 29.03 ml/min/mmHg
DLCO unc % pred: 124 %
DLCO unc: 29.51 ml/min/mmHg
FEF 25-75 Post: 1.78 L/sec
FEF 25-75 Pre: 1.22 L/sec
FEF2575-%Change-Post: 45 %
FEF2575-%Pred-Post: 64 %
FEF2575-%Pred-Pre: 44 %
FEV1-%Change-Post: 9 %
FEV1-%Pred-Post: 73 %
FEV1-%Pred-Pre: 66 %
FEV1-Post: 2.24 L
FEV1-Pre: 2.06 L
FEV1FVC-%Change-Post: -1 %
FEV1FVC-%Pred-Pre: 83 %
FEV6-%Change-Post: 11 %
FEV6-%Pred-Post: 92 %
FEV6-%Pred-Pre: 82 %
FEV6-Post: 3.53 L
FEV6-Pre: 3.16 L
FEV6FVC-%Change-Post: 1 %
FEV6FVC-%Pred-Post: 103 %
FEV6FVC-%Pred-Pre: 102 %
FVC-%Change-Post: 10 %
FVC-%Pred-Post: 89 %
FVC-%Pred-Pre: 80 %
FVC-Post: 3.56 L
FVC-Pre: 3.22 L
Post FEV1/FVC ratio: 63 %
Post FEV6/FVC ratio: 99 %
Pre FEV1/FVC ratio: 64 %
Pre FEV6/FVC Ratio: 98 %
RV % pred: 201 %
RV: 3.61 L
TLC % pred: 116 %
TLC: 6.73 L

## 2022-10-18 MED ORDER — BREZTRI AEROSPHERE 160-9-4.8 MCG/ACT IN AERO: 2.0000 | INHALATION_SPRAY | Freq: Two times a day (BID) | RESPIRATORY_TRACT | 0 refills | Status: AC

## 2022-10-18 MED ORDER — BREZTRI AEROSPHERE 160-9-4.8 MCG/ACT IN AERO
2.0000 | INHALATION_SPRAY | Freq: Two times a day (BID) | RESPIRATORY_TRACT | 11 refills | Status: AC
  Filled 2022-10-18: qty 10.7, 30d supply, fill #0
  Filled 2023-01-14: qty 10.7, 30d supply, fill #1
  Filled 2023-03-17 – 2023-04-12 (×2): qty 10.7, 30d supply, fill #2

## 2022-10-18 NOTE — Patient Instructions (Signed)
Full PFT performed today. °

## 2022-10-18 NOTE — Patient Instructions (Addendum)
-   breztri 2 puffs twice day - rinse mouth and brush tongue/teeth after each use - albuterol 1-2 puffs as needed and can use 5-20 minutes before exercise - this is a rescue inhaler - see you in October or sooner if need be - american lung association has video about how to use inhaler with spacer - think about whether or not you'd like to start lung cancer screening 07/2023

## 2022-10-18 NOTE — Addendum Note (Signed)
Addended by: Rosana Berger on: 10/18/2022 02:00 PM   Modules accepted: Orders

## 2022-10-18 NOTE — Progress Notes (Signed)
Full PFT performed today. °

## 2022-10-25 ENCOUNTER — Other Ambulatory Visit (HOSPITAL_COMMUNITY): Payer: Self-pay

## 2022-11-16 ENCOUNTER — Other Ambulatory Visit (HOSPITAL_COMMUNITY): Payer: Self-pay

## 2022-11-18 ENCOUNTER — Other Ambulatory Visit (HOSPITAL_COMMUNITY): Payer: Self-pay

## 2022-12-17 ENCOUNTER — Other Ambulatory Visit (HOSPITAL_COMMUNITY): Payer: Self-pay

## 2023-01-14 ENCOUNTER — Other Ambulatory Visit: Payer: Self-pay

## 2023-01-14 ENCOUNTER — Other Ambulatory Visit (HOSPITAL_COMMUNITY): Payer: Self-pay

## 2023-01-14 MED ORDER — SERTRALINE HCL 50 MG PO TABS
50.0000 mg | ORAL_TABLET | Freq: Every day | ORAL | 2 refills | Status: AC
  Filled 2023-01-14 – 2023-01-15 (×2): qty 30, 30d supply, fill #0
  Filled 2023-02-15: qty 30, 30d supply, fill #1
  Filled 2023-03-17: qty 30, 30d supply, fill #2

## 2023-01-15 ENCOUNTER — Other Ambulatory Visit (HOSPITAL_COMMUNITY): Payer: Self-pay

## 2023-02-15 ENCOUNTER — Other Ambulatory Visit (HOSPITAL_COMMUNITY): Payer: Self-pay

## 2023-02-17 ENCOUNTER — Other Ambulatory Visit (HOSPITAL_COMMUNITY): Payer: Self-pay

## 2023-02-17 MED ORDER — SERTRALINE HCL 50 MG PO TABS
50.0000 mg | ORAL_TABLET | Freq: Every day | ORAL | 2 refills | Status: DC
  Filled 2023-02-17 – 2023-04-19 (×3): qty 30, 30d supply, fill #0
  Filled 2023-05-24: qty 30, 30d supply, fill #1
  Filled 2023-07-02: qty 30, 30d supply, fill #2

## 2023-02-24 ENCOUNTER — Other Ambulatory Visit (HOSPITAL_COMMUNITY): Payer: Self-pay

## 2023-02-24 MED ORDER — METFORMIN HCL 500 MG PO TABS
500.0000 mg | ORAL_TABLET | Freq: Two times a day (BID) | ORAL | 11 refills | Status: AC
  Filled 2023-02-24 – 2023-03-17 (×2): qty 60, 30d supply, fill #0
  Filled 2023-04-12 – 2023-04-19 (×2): qty 60, 30d supply, fill #1

## 2023-02-26 ENCOUNTER — Other Ambulatory Visit (HOSPITAL_COMMUNITY): Payer: Self-pay

## 2023-03-04 ENCOUNTER — Other Ambulatory Visit (HOSPITAL_COMMUNITY): Payer: Self-pay

## 2023-03-17 ENCOUNTER — Other Ambulatory Visit (HOSPITAL_COMMUNITY): Payer: Self-pay

## 2023-04-13 ENCOUNTER — Other Ambulatory Visit (HOSPITAL_COMMUNITY): Payer: Self-pay

## 2023-04-19 ENCOUNTER — Other Ambulatory Visit (HOSPITAL_COMMUNITY): Payer: Self-pay

## 2023-05-24 ENCOUNTER — Other Ambulatory Visit (HOSPITAL_COMMUNITY): Payer: Self-pay

## 2023-06-21 ENCOUNTER — Other Ambulatory Visit (HOSPITAL_BASED_OUTPATIENT_CLINIC_OR_DEPARTMENT_OTHER): Payer: Self-pay

## 2023-06-21 ENCOUNTER — Emergency Department (HOSPITAL_BASED_OUTPATIENT_CLINIC_OR_DEPARTMENT_OTHER)
Admission: EM | Admit: 2023-06-21 | Discharge: 2023-06-21 | Disposition: A | Discharge status: Home / Self Care | Payer: Worker's Compensation | Attending: Emergency Medicine | Admitting: Emergency Medicine

## 2023-06-21 ENCOUNTER — Emergency Department (HOSPITAL_BASED_OUTPATIENT_CLINIC_OR_DEPARTMENT_OTHER): Payer: Worker's Compensation

## 2023-06-21 ENCOUNTER — Encounter (HOSPITAL_BASED_OUTPATIENT_CLINIC_OR_DEPARTMENT_OTHER): Payer: Self-pay

## 2023-06-21 DIAGNOSIS — S60413A Abrasion of left middle finger, initial encounter: Secondary | ICD-10-CM | POA: Insufficient documentation

## 2023-06-21 DIAGNOSIS — W230XXA Caught, crushed, jammed, or pinched between moving objects, initial encounter: Secondary | ICD-10-CM | POA: Insufficient documentation

## 2023-06-21 DIAGNOSIS — Y990 Civilian activity done for income or pay: Secondary | ICD-10-CM | POA: Diagnosis not present

## 2023-06-21 DIAGNOSIS — Z23 Encounter for immunization: Secondary | ICD-10-CM | POA: Insufficient documentation

## 2023-06-21 DIAGNOSIS — S60415A Abrasion of left ring finger, initial encounter: Secondary | ICD-10-CM | POA: Insufficient documentation

## 2023-06-21 DIAGNOSIS — S6722XA Crushing injury of left hand, initial encounter: Secondary | ICD-10-CM

## 2023-06-21 DIAGNOSIS — S6992XA Unspecified injury of left wrist, hand and finger(s), initial encounter: Secondary | ICD-10-CM | POA: Diagnosis present

## 2023-06-21 MED ORDER — TETANUS-DIPHTH-ACELL PERTUSSIS 5-2.5-18.5 LF-MCG/0.5 IM SUSY
0.5000 mL | PREFILLED_SYRINGE | Freq: Once | INTRAMUSCULAR | Status: AC
  Administered 2023-06-21: 0.5 mL via INTRAMUSCULAR
  Filled 2023-06-21: qty 0.5

## 2023-06-21 MED ORDER — OXYCODONE-ACETAMINOPHEN 5-325 MG PO TABS
1.0000 | ORAL_TABLET | Freq: Once | ORAL | Status: AC
  Administered 2023-06-21: 1 via ORAL
  Filled 2023-06-21: qty 1

## 2023-06-21 MED ORDER — OXYCODONE-ACETAMINOPHEN 5-325 MG PO TABS
1.0000 | ORAL_TABLET | Freq: Four times a day (QID) | ORAL | 0 refills | Status: AC | PRN
  Filled 2023-06-21: qty 10, 3d supply, fill #0

## 2023-06-21 MED ORDER — SENNOSIDES-DOCUSATE SODIUM 8.6-50 MG PO TABS
1.0000 | ORAL_TABLET | Freq: Every evening | ORAL | 0 refills | Status: AC | PRN
  Filled 2023-06-21: qty 100, 90d supply, fill #0

## 2023-06-21 NOTE — ED Provider Notes (Signed)
Emergency Department Provider Note   I have reviewed the triage vital signs and the nursing notes.   HISTORY  Chief Complaint Hand Injury   HPI Glen Carter is a 57 y.o. male with past history reviewed below presents emergency department with injury to the left hand.  Patient was at work when he sustained a crush injury to the middle and ring fingers on the left hand.  He is right-hand dominant.  There is some minimal abrasion to the fingers.  No numbness.  He notes some swelling and bruising.  Unsure of his last tetanus.    Past Medical History:  Diagnosis Date   Acute back pain    Migraine    Migraines    SAH (subarachnoid hemorrhage) (HCC)     Review of Systems  Constitutional: No fever/chills Cardiovascular: Denies chest pain. Respiratory: Denies shortness of breath. Gastrointestinal: No abdominal pain.  No nausea, no vomiting.   Musculoskeletal: Left hand/finger pain.  Skin: Negative for rash. Neurological: Negative for numbness.   ____________________________________________   PHYSICAL EXAM:  VITAL SIGNS: ED Triage Vitals  Encounter Vitals Group     BP 06/21/23 0723 133/77     Pulse Rate 06/21/23 0723 74     Resp 06/21/23 0723 18     Temp 06/21/23 0723 97.6 F (36.4 C)     Temp Source 06/21/23 0723 Oral     SpO2 06/21/23 0723 98 %     Weight 06/21/23 0721 130 lb (59 kg)     Height 06/21/23 0721 5\' 4"  (1.626 m)   Constitutional: Alert and oriented. Well appearing and in no acute distress. Eyes: Conjunctivae are normal.  Head: Atraumatic. Nose: No congestion/rhinnorhea. Mouth/Throat: Mucous membranes are moist.  Neck: No stridor.   Cardiovascular: Normal rate, regular rhythm. Good peripheral circulation. Grossly normal heart sounds.   Respiratory: Normal respiratory effort.  Gastrointestinal: No distention.  Musculoskeletal: Swelling/pain to the left Jadence Kinlaw and ring fingers.  Abrasions noted without deeper laceration.  Flexion limited due to  pain.  No obvious open fracture.  Fingertip is well-perfused. No nailbed injury.  Neurologic:  Normal speech and language. No gross focal neurologic deficits are appreciated.  Skin:  Skin is warm, dry and intact. No rash noted.  ____________________________________________  RADIOLOGY  DG Hand Complete Left  Result Date: 06/21/2023 CLINICAL DATA:  Hand injury. Smashed left hand in machine at work. Laceration to middle finger. Bruising to fraying finger. EXAM: LEFT HAND - COMPLETE 3+ VIEW COMPARISON:  Left hand radiographs 09/13/2022 FINDINGS: Interval healing of the previously seen oblique fracture of the mid to distal shaft of the proximal phalanx of the index finger. Interval clearance of the prior 3 punctate foci of debris within the proximal lateral index finger soft tissues. Mild joint space narrowing and peripheral osteophytosis of the second through fifth DIP joints, greatest within the index finger. Mild second and third PIP joint space narrowing and peripheral osteophytosis. Minimal thumb carpometacarpal joint space narrowing and peripheral osteophytosis. 2 mm ulnar positive variance. No acute fracture or dislocation. IMPRESSION: Compared to 09/13/2022: 1. Interval healing of the previously seen oblique fracture of the mid to distal shaft of the proximal phalanx of the index finger. 2. 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 3. No acute fracture. Electronically Signed   By: Neita Garnet M.D.   On: 06/21/2023 08:46    ____________________________________________   PROCEDURES  Procedure(s) performed:   Procedures  None  ____________________________________________  INITIAL IMPRESSION / ASSESSMENT AND PLAN / ED COURSE  Pertinent labs & imaging results that were available during my care of the patient were reviewed by me and considered in my medical decision making (see chart for details).   This patient is Presenting for  Evaluation of hand injury, which does require a range of treatment options, and is a complaint that involves a moderate risk of morbidity and mortality.  The Differential Diagnoses include fracture, contusion, laceration, dislocation, etc.  Critical Interventions-    Medications  oxyCODONE-acetaminophen (PERCOCET/ROXICET) 5-325 MG per tablet 1 tablet (1 tablet Oral Given 06/21/23 0734)  Tdap (BOOSTRIX) injection 0.5 mL (0.5 mLs Intramuscular Given 06/21/23 0734)    Reassessment after intervention:  pain improved.   I decided to review pertinent External Data, and in summary patient with laceration in Feb but no Tdap at that time.   Radiologic Tests Ordered, included hand XR. I independently interpreted the images and agree with radiology interpretation.   Medical Decision Making: Summary:  Patient presents emergency department with crush injury to the left hand, mainly involving the fingers.  Minimal skin breakdown; mainly abrasions.  No laceration for repair.  I will update the patient's tetanus on this visit as he is unsure of his last shot and none is documented. Percocet given for pain.   Reevaluation with update and discussion with patient.  No fracture on x-ray.  Plan for RICE treatment and orthopedic follow-up as needed.  He is established with [facility] after his prior hand injury.  Plan to take him out of work for the next week.  Will send a small amount of pain medication to his pharmacy.  Patient's presentation is most consistent with acute, uncomplicated illness.   Disposition: discharge  ____________________________________________  FINAL CLINICAL IMPRESSION(S) / ED DIAGNOSES  Final diagnoses:  Crushing injury of left hand, initial encounter     NEW OUTPATIENT MEDICATIONS STARTED DURING THIS VISIT:  New Prescriptions   OXYCODONE-ACETAMINOPHEN (PERCOCET/ROXICET) 5-325 MG TABLET    Take 1 tablet by mouth every 6 (six) hours as needed for severe pain (pain score 7-10).    SENNA-DOCUSATE (SENOKOT-S) 8.6-50 MG TABLET    Take 1 tablet by mouth at bedtime as needed for mild constipation.    Note:  This document was prepared using Dragon voice recognition software and may include unintentional dictation errors.  Alona Bene, MD, [facility]    Kevona Lupinacci, Arlyss Repress, MD 06/21/23 [phone]

## 2023-06-21 NOTE — ED Triage Notes (Signed)
States smashed left hand in machine at work, laceration to middle finger, bruising to ring finger. Tetanus up to date

## 2023-06-21 NOTE — Discharge Instructions (Signed)
Keep your hand elevated and apply ice intermittently over the next 12 hours.  I have called in a small amount of pain medicines but she cannot take these with alcohol, other strong pain medication, or if you will be driving.  I will take you out of work for the next week.  If you have worsening pain you should either return to the emergency department or follow closely with the orthopedist listed.

## 2023-07-02 ENCOUNTER — Other Ambulatory Visit (HOSPITAL_COMMUNITY): Payer: Self-pay

## 2023-08-05 ENCOUNTER — Other Ambulatory Visit (HOSPITAL_COMMUNITY): Payer: Self-pay

## 2023-08-05 MED ORDER — SERTRALINE HCL 50 MG PO TABS
50.0000 mg | ORAL_TABLET | Freq: Every day | ORAL | 2 refills | Status: DC
  Filled 2023-08-05: qty 30, 30d supply, fill #0
  Filled 2024-04-04: qty 60, 60d supply, fill #1

## 2023-08-06 ENCOUNTER — Other Ambulatory Visit (HOSPITAL_COMMUNITY): Payer: Self-pay

## 2023-08-26 ENCOUNTER — Other Ambulatory Visit: Payer: Self-pay

## 2023-08-26 ENCOUNTER — Other Ambulatory Visit (HOSPITAL_COMMUNITY): Payer: Self-pay

## 2023-08-26 MED ORDER — SERTRALINE HCL 100 MG PO TABS
100.0000 mg | ORAL_TABLET | Freq: Every day | ORAL | 1 refills | Status: AC
  Filled 2023-08-26: qty 30, 30d supply, fill #0
  Filled 2023-10-01 (×2): qty 30, 30d supply, fill #1
  Filled 2023-10-29: qty 90, 90d supply, fill #2
  Filled 2024-02-11 (×2): qty 30, 30d supply, fill #3

## 2023-08-26 MED ORDER — SERTRALINE HCL 50 MG PO TABS
50.0000 mg | ORAL_TABLET | Freq: Every day | ORAL | 1 refills | Status: DC
  Filled 2023-08-26: qty 30, 30d supply, fill #0

## 2023-09-14 ENCOUNTER — Other Ambulatory Visit (HOSPITAL_COMMUNITY): Payer: Self-pay

## 2023-09-14 MED ORDER — METHYLPREDNISOLONE 4 MG PO TBPK
ORAL_TABLET | ORAL | 0 refills | Status: AC
  Filled 2023-09-14: qty 21, 6d supply, fill #0

## 2023-09-14 MED ORDER — AZITHROMYCIN 250 MG PO TABS
ORAL_TABLET | ORAL | 0 refills | Status: AC
  Filled 2023-09-14: qty 6, 5d supply, fill #0

## 2023-09-14 MED ORDER — BENZONATATE 100 MG PO CAPS
ORAL_CAPSULE | ORAL | 0 refills | Status: DC
  Filled 2023-09-14: qty 30, 10d supply, fill #0

## 2023-10-01 ENCOUNTER — Other Ambulatory Visit (HOSPITAL_COMMUNITY): Payer: Self-pay

## 2023-10-01 ENCOUNTER — Other Ambulatory Visit: Payer: Self-pay | Admitting: Student

## 2023-10-03 ENCOUNTER — Other Ambulatory Visit (HOSPITAL_COMMUNITY): Payer: Self-pay

## 2023-10-29 ENCOUNTER — Other Ambulatory Visit (HOSPITAL_COMMUNITY): Payer: Self-pay

## 2023-11-28 ENCOUNTER — Other Ambulatory Visit (HOSPITAL_COMMUNITY): Payer: Self-pay

## 2024-01-13 ENCOUNTER — Encounter (HOSPITAL_COMMUNITY): Payer: Self-pay | Admitting: Interventional Radiology

## 2024-02-11 ENCOUNTER — Other Ambulatory Visit (HOSPITAL_COMMUNITY): Payer: Self-pay

## 2024-03-03 ENCOUNTER — Other Ambulatory Visit (HOSPITAL_COMMUNITY): Payer: Self-pay

## 2024-03-03 MED ORDER — ACETAMINOPHEN 325 MG PO TABS
650.0000 mg | ORAL_TABLET | ORAL | 0 refills | Status: AC | PRN
  Filled 2024-03-03: qty 30, 3d supply, fill #0

## 2024-03-03 MED ORDER — IBUPROFEN 600 MG PO TABS
600.0000 mg | ORAL_TABLET | Freq: Three times a day (TID) | ORAL | 0 refills | Status: DC | PRN
  Filled 2024-03-03: qty 15, 5d supply, fill #0

## 2024-03-13 ENCOUNTER — Other Ambulatory Visit (HOSPITAL_COMMUNITY): Payer: Self-pay

## 2024-04-04 ENCOUNTER — Other Ambulatory Visit (HOSPITAL_COMMUNITY): Payer: Self-pay

## 2024-06-19 ENCOUNTER — Other Ambulatory Visit (HOSPITAL_COMMUNITY): Payer: Self-pay

## 2024-06-20 ENCOUNTER — Other Ambulatory Visit (HOSPITAL_COMMUNITY): Payer: Self-pay

## 2024-06-20 MED ORDER — SERTRALINE HCL 50 MG PO TABS
50.0000 mg | ORAL_TABLET | Freq: Every day | ORAL | 2 refills | Status: DC
  Filled 2024-06-20: qty 30, 30d supply, fill #0

## 2024-06-21 ENCOUNTER — Other Ambulatory Visit: Payer: Self-pay

## 2024-06-21 ENCOUNTER — Other Ambulatory Visit (HOSPITAL_COMMUNITY): Payer: Self-pay

## 2024-06-21 MED ORDER — SERTRALINE HCL 100 MG PO TABS
100.0000 mg | ORAL_TABLET | Freq: Every day | ORAL | 1 refills | Status: AC
  Filled 2024-06-21: qty 90, 90d supply, fill #0

## 2024-06-22 ENCOUNTER — Other Ambulatory Visit (HOSPITAL_COMMUNITY): Payer: Self-pay

## 2024-07-10 ENCOUNTER — Other Ambulatory Visit (HOSPITAL_COMMUNITY): Payer: Self-pay

## 2024-07-10 MED ORDER — ALBUTEROL SULFATE HFA 108 (90 BASE) MCG/ACT IN AERS
2.0000 | INHALATION_SPRAY | Freq: Four times a day (QID) | RESPIRATORY_TRACT | 3 refills | Status: AC | PRN
  Filled 2024-07-10: qty 6.7, 25d supply, fill #0

## 2024-07-16 ENCOUNTER — Other Ambulatory Visit (HOSPITAL_COMMUNITY): Payer: Self-pay

## 2024-07-16 MED ORDER — METFORMIN HCL 500 MG PO TABS
500.0000 mg | ORAL_TABLET | Freq: Two times a day (BID) | ORAL | 5 refills | Status: AC
  Filled 2024-07-16: qty 60, 30d supply, fill #0

## 2024-08-03 ENCOUNTER — Other Ambulatory Visit (HOSPITAL_COMMUNITY): Payer: Self-pay
